# Patient Record
Sex: Male | Born: 1955 | Race: Black or African American | Hispanic: No | Marital: Single | Smoking: Current every day smoker
Health system: Southern US, Community
[De-identification: ages and names within clinical notes are randomized; demographics above are authoritative.]

## PROBLEM LIST (undated history)

## (undated) DIAGNOSIS — E119 Type 2 diabetes mellitus without complications: Secondary | ICD-10-CM

## (undated) DIAGNOSIS — K219 Gastro-esophageal reflux disease without esophagitis: Secondary | ICD-10-CM

## (undated) HISTORY — DX: Gastro-esophageal reflux disease without esophagitis: K21.9

---

## 2000-04-12 ENCOUNTER — Emergency Department (HOSPITAL_COMMUNITY): Admission: EM | Admit: 2000-04-12 | Discharge: 2000-04-12 | Payer: Self-pay | Admitting: *Deleted

## 2000-04-13 ENCOUNTER — Emergency Department (HOSPITAL_COMMUNITY): Admission: EM | Admit: 2000-04-13 | Discharge: 2000-04-13 | Payer: Self-pay | Admitting: Emergency Medicine

## 2002-05-31 ENCOUNTER — Emergency Department (HOSPITAL_COMMUNITY): Admission: EM | Admit: 2002-05-31 | Discharge: 2002-05-31 | Payer: Self-pay | Admitting: Emergency Medicine

## 2004-01-13 ENCOUNTER — Emergency Department (HOSPITAL_COMMUNITY): Admission: EM | Admit: 2004-01-13 | Discharge: 2004-01-13 | Payer: Self-pay | Admitting: Emergency Medicine

## 2014-05-03 ENCOUNTER — Ambulatory Visit (INDEPENDENT_AMBULATORY_CARE_PROVIDER_SITE_OTHER): Payer: Self-pay | Admitting: Family Medicine

## 2014-05-03 VITALS — BP 122/67 | HR 92 | Temp 98.1°F | Resp 18 | Ht 72.0 in | Wt 261.4 lb

## 2014-05-03 DIAGNOSIS — Z0289 Encounter for other administrative examinations: Secondary | ICD-10-CM

## 2014-05-03 DIAGNOSIS — Z008 Encounter for other general examination: Secondary | ICD-10-CM

## 2014-05-03 NOTE — Progress Notes (Signed)
Commercial Driver Medical Examination   Andrew Davenport is a 58 y.o. male who presents today for a commercial driver fitness determination physical exam. The patient reports no problems. The following portions of the patient's history were reviewed and updated as appropriate: allergies, current medications, past family history, past medical history, past social history, past surgical history and problem list. Review of Systems A comprehensive review of systems was negative.   Objective:    Vision:   Visual Acuity Screening   Right eye Left eye Both eyes  Without correction:     With correction: 20/15 20/20 20/20-1  Comments: Peripheral Vision: Right eye 85 degrees. Left eye 85 degrees.  The patient can distinguish the colors red, amber and green.  Applicant can recognize and distinguish among traffic control signals and devices showing standard red, green, and amber colors.  Applicant meets visual acuity requirement only when wearing corrective lenses.  Monocular Vision?: No   Hearing:  Hearing Screening Comments: The patient was able to hear a forced whisper from 10 feet.    BP 122/67 mmHg  Pulse 92  Temp(Src) 98.1 F (36.7 C) (Oral)  Resp 18  Ht 6' (1.829 m)  Wt 261 lb 6.4 oz (118.57 kg)  BMI 35.44 kg/m2  SpO2 98%  General Appearance:    Alert, cooperative, no distress, appears stated age  Head:    Normocephalic, without obvious abnormality, atraumatic  Eyes:    PERRL, conjunctiva/corneas clear, EOM's intact, fundi    benign, both eyes       Ears:    Normal TM's and external ear canals, both ears  Nose:   Nares normal, septum midline, mucosa normal, no drainage    or sinus tenderness  Throat:   Lips, mucosa, and tongue normal; teeth and gums normal  Neck:   Supple, symmetrical, trachea midline, no adenopathy;       thyroid:  No enlargement/tenderness/nodules; no carotid   bruit or JVD  Back:     Symmetric, no curvature, ROM normal, no CVA tenderness  Lungs:      Clear to auscultation bilaterally, respirations unlabored  Chest wall:    No tenderness or deformity  Heart:    Regular rate and rhythm, S1 and S2 normal, no murmur, rub   or gallop  Abdomen:     Soft, non-tender, bowel sounds active all four quadrants,    no masses, no organomegaly  Genitalia:    Normal male without lesion, discharge or tenderness  Rectal:    Normal tone, normal prostate, no masses or tenderness;   guaiac negative stool  Extremities:   Extremities normal, atraumatic, no cyanosis or edema  Pulses:   2+ and symmetric all extremities  Skin:   Skin color, texture, turgor normal, no rashes or lesions  Lymph nodes:   Cervical, supraclavicular, and axillary nodes normal  Neurologic:   CNII-XII intact. Normal strength, sensation and reflexes      throughout    Labs: No results found for: SPECGRAV, PROTEINUR, BILIRUBINUR, GLUCOSEU  Urine SG 1.015, neg gluc, neg blood, neg prot  Assessment:    Healthy male exam.  Meets standards in 30 CFR 391.41;  qualifies for 2 year certificate.    Plan:    Medical examiners certificate completed and printed. Return as needed.

## 2016-04-29 ENCOUNTER — Ambulatory Visit (INDEPENDENT_AMBULATORY_CARE_PROVIDER_SITE_OTHER): Payer: Self-pay | Admitting: Family Medicine

## 2016-04-29 VITALS — BP 128/80 | HR 89 | Temp 98.5°F | Resp 17 | Ht 72.0 in | Wt 277.0 lb

## 2016-04-29 DIAGNOSIS — Z024 Encounter for examination for driving license: Secondary | ICD-10-CM

## 2016-04-29 NOTE — Patient Instructions (Addendum)
  2 year DOT card given. If you experience more snoring, or any daytime sleepiness, discuss those symptoms with your primary care provider as a sleep study may be indicated.     IF you received an x-ray today, you will receive an invoice from [facility]. Please contact [facility] at [phone] with questions or concerns regarding your invoice.   IF you received labwork today, you will receive an invoice from [employer]. Please contact [facility] at [phone] with questions or concerns regarding your invoice.   Our billing staff will not be able to assist you with questions regarding bills from these companies.  You will be contacted with the lab results as soon as they are available. The fastest way to get your results is to activate your My Chart account. Instructions are located on the last page of this paperwork. If you have not heard from [place] regarding the results in 2 weeks, please contact this office.

## 2016-04-29 NOTE — Progress Notes (Signed)
Subjective:  By signing my name below, I, [name], attest that this documentation has been prepared under the direction and in the presence of [name], MD Electronically Signed: Ladene Artist, ED Scribe 04/29/2016 at 11:22 AM.   Patient ID: Andrew Davenport, male    DOB: 1956-04-29, 60 y.o.   MRN: [id number]  Chief Complaint  Patient presents with  . Employment Physical    DOT    HPI HPI Comments: Andrew Davenport is a 60 y.o. male who presents to the [facility] for a DOT physical. No listed medial problems or medications. He does smoke tobacco. Previous DOT physical in Dec 2015, 2 year card. No concerns identified. Pt denies coughing spells while driving, sob, unexplained weight loss, fever, chest pain, weakness in extremities. He reports occasional snoring but denies daytime somnolence or sleep apnea. Pt denies depression or difficulty coping with the loss of his father 2 weeks ago.  There are no active problems to display for this patient.  Past Medical History:  Diagnosis Date  . GERD (gastroesophageal reflux disease)    No past surgical history on file. No Known Allergies Prior to Admission medications   Not on File   Social History   Social History  . Marital status: Single    Spouse name: N/A  . Number of children: N/A  . Years of education: N/A   Occupational History  . Not on file.   Social History Main Topics  . Smoking status: Current Every Day Smoker    Packs/day: 0.25    Years: 43.00  . Smokeless tobacco: Never Used  . Alcohol use No  . Drug use: No  . Sexual activity: No   Other Topics Concern  . Not on file   Social History Narrative  . No narrative on file   Review of Systems  Constitutional: Negative for fatigue, fever and unexpected weight change.  Respiratory: Negative for cough and shortness of breath.   Cardiovascular: Negative for chest pain.  Neurological: Negative for weakness.    Psychiatric/Behavioral: Negative for dysphoric mood and sleep disturbance.      Objective:   Physical Exam  Constitutional: He is oriented to person, place, and time. He appears well-developed and well-nourished.  HENT:  Head: Normocephalic and atraumatic.  Right Ear: External ear normal.  Left Ear: External ear normal.  Mouth/Throat: Oropharynx is clear and moist.  Eyes: Conjunctivae and EOM are normal. Pupils are equal, round, and reactive to light.  Neck: Normal range of motion. Neck supple. No thyromegaly present.  Cardiovascular: Normal rate, regular rhythm, normal heart sounds and intact distal pulses.   Pulmonary/Chest: Effort normal and breath sounds normal. No respiratory distress. He has no wheezes.  Abdominal: Soft. He exhibits no distension. There is no tenderness. Hernia confirmed negative in the right inguinal area and confirmed negative in the left inguinal area.  Musculoskeletal: Normal range of motion. He exhibits no edema or tenderness.  Lymphadenopathy:    He has no cervical adenopathy.  Neurological: He is alert and oriented to person, place, and time. He has normal reflexes.  Skin: Skin is warm and dry.  Psychiatric: He has a normal mood and affect. His behavior is normal.  Vitals reviewed.   Vitals:   04/29/16 1102  BP: 128/80  Pulse: 89  Resp: 17  Temp: 98.5 F (36.9 C)  TempSrc: Oral  SpO2: 96%  Weight: 277 lb (125.6 kg)  Height: 6' (1.829 m)    Visual  Acuity Screening   Right eye Left eye Both eyes  Without correction:     With correction: '20/30 20/30 20/30 '$  Hearing Screening Comments: Peripheral Vision: Right eye 85 degrees. Left eye 85 degrees. The patient can distinguish the colors red, amber and green. The patient was able to hear a forced whisper from L=10 R=10  feet.     Assessment & Plan:  Andrew Davenport is a 60 y.o. male Encounter for commercial driver medical examination (CDME) No concerning findings on exam or history. BMI  greater than 35, but denies persistent daytime somnolence or persistent snoring. If he does have more snoring, or any daytime somnolence, advised to discuss with PCP for possible sleep study. 2 year card given, corrective lenses, see DOT paperwork.  No orders of the defined types were placed in this encounter.  Patient Instructions    2 year DOT card given. If you experience more snoring, or any daytime sleepiness, discuss those symptoms with your primary care provider as a sleep study may be indicated.     IF you received an x-ray today, you will receive an invoice from [facility]. Please contact [facility] at [phone] with questions or concerns regarding your invoice.   IF you received labwork today, you will receive an invoice from [employer]. Please contact [facility] at [phone] with questions or concerns regarding your invoice.   Our billing staff will not be able to assist you with questions regarding bills from these companies.  You will be contacted with the lab results as soon as they are available. The fastest way to get your results is to activate your My Chart account. Instructions are located on the last page of this paperwork. If you have not heard from [place] regarding the results in 2 weeks, please contact this office.         I personally performed the services described in this documentation, which was scribed in my presence. The recorded information has been reviewed and considered, and addended by me as needed.   Signed,   Merri Ray, MD [facility].  04/29/16 11:52 AM

## 2019-01-06 ENCOUNTER — Emergency Department (HOSPITAL_COMMUNITY): Payer: No Typology Code available for payment source

## 2019-01-06 ENCOUNTER — Inpatient Hospital Stay (HOSPITAL_COMMUNITY)
Admission: EM | Admit: 2019-01-06 | Discharge: 2019-01-13 | DRG: 166 | Disposition: A | Discharge status: Home Health | Payer: No Typology Code available for payment source | Attending: Family Medicine | Admitting: Family Medicine

## 2019-01-06 ENCOUNTER — Other Ambulatory Visit: Payer: Self-pay

## 2019-01-06 DIAGNOSIS — Z8701 Personal history of pneumonia (recurrent): Secondary | ICD-10-CM

## 2019-01-06 DIAGNOSIS — Z515 Encounter for palliative care: Secondary | ICD-10-CM | POA: Diagnosis present

## 2019-01-06 DIAGNOSIS — J9621 Acute and chronic respiratory failure with hypoxia: Secondary | ICD-10-CM | POA: Diagnosis present

## 2019-01-06 DIAGNOSIS — G893 Neoplasm related pain (acute) (chronic): Secondary | ICD-10-CM | POA: Diagnosis present

## 2019-01-06 DIAGNOSIS — Z20828 Contact with and (suspected) exposure to other viral communicable diseases: Secondary | ICD-10-CM | POA: Diagnosis present

## 2019-01-06 DIAGNOSIS — E119 Type 2 diabetes mellitus without complications: Secondary | ICD-10-CM | POA: Diagnosis present

## 2019-01-06 DIAGNOSIS — D1809 Hemangioma of other sites: Secondary | ICD-10-CM | POA: Diagnosis present

## 2019-01-06 DIAGNOSIS — J90 Pleural effusion, not elsewhere classified: Secondary | ICD-10-CM | POA: Diagnosis not present

## 2019-01-06 DIAGNOSIS — C3491 Malignant neoplasm of unspecified part of right bronchus or lung: Secondary | ICD-10-CM | POA: Diagnosis present

## 2019-01-06 DIAGNOSIS — F419 Anxiety disorder, unspecified: Secondary | ICD-10-CM | POA: Diagnosis not present

## 2019-01-06 DIAGNOSIS — J910 Malignant pleural effusion: Secondary | ICD-10-CM | POA: Diagnosis present

## 2019-01-06 DIAGNOSIS — E1122 Type 2 diabetes mellitus with diabetic chronic kidney disease: Secondary | ICD-10-CM | POA: Diagnosis not present

## 2019-01-06 DIAGNOSIS — C3490 Malignant neoplasm of unspecified part of unspecified bronchus or lung: Secondary | ICD-10-CM | POA: Diagnosis not present

## 2019-01-06 DIAGNOSIS — Z7984 Long term (current) use of oral hypoglycemic drugs: Secondary | ICD-10-CM | POA: Diagnosis not present

## 2019-01-06 DIAGNOSIS — Z87891 Personal history of nicotine dependence: Secondary | ICD-10-CM | POA: Diagnosis not present

## 2019-01-06 DIAGNOSIS — R0902 Hypoxemia: Secondary | ICD-10-CM

## 2019-01-06 DIAGNOSIS — Z7189 Other specified counseling: Secondary | ICD-10-CM | POA: Diagnosis not present

## 2019-01-06 DIAGNOSIS — Z66 Do not resuscitate: Secondary | ICD-10-CM | POA: Diagnosis not present

## 2019-01-06 DIAGNOSIS — Z833 Family history of diabetes mellitus: Secondary | ICD-10-CM | POA: Diagnosis not present

## 2019-01-06 DIAGNOSIS — D638 Anemia in other chronic diseases classified elsewhere: Secondary | ICD-10-CM | POA: Diagnosis present

## 2019-01-06 DIAGNOSIS — K219 Gastro-esophageal reflux disease without esophagitis: Secondary | ICD-10-CM | POA: Diagnosis present

## 2019-01-06 DIAGNOSIS — E871 Hypo-osmolality and hyponatremia: Secondary | ICD-10-CM

## 2019-01-06 LAB — BASIC METABOLIC PANEL
Anion gap: 15 (ref 5–15)
BUN: 8 mg/dL (ref 6–20)
CO2: 27 mmol/L (ref 22–32)
Calcium: 9.4 mg/dL (ref 8.9–10.3)
Chloride: 89 mmol/L — ABNORMAL LOW (ref 98–111)
Creatinine, Ser: 0.91 mg/dL (ref 0.61–1.24)
GFR calc Af Amer: >60 mL/min (ref >60)
GFR calc non Af Amer: >60 mL/min (ref >60)
Glucose, Bld: 131 mg/dL — ABNORMAL HIGH (ref 70–99)
Potassium: 3.7 mmol/L (ref 3.5–5.1)
Sodium: 131 mmol/L — ABNORMAL LOW (ref 135–145)

## 2019-01-06 LAB — CBC WITH DIFFERENTIAL/PLATELET
Abs Immature Granulocytes: 0.18 10*3/uL — ABNORMAL HIGH (ref 0.00–0.07)
Basophils Absolute: 0 10*3/uL (ref 0.0–0.1)
Basophils Relative: 0 %
Eosinophils Absolute: 0.1 10*3/uL (ref 0.0–0.5)
Eosinophils Relative: 1 %
HCT: 44.3 % (ref 39.0–52.0)
Hemoglobin: 14.4 g/dL (ref 13.0–17.0)
Immature Granulocytes: 2 %
Lymphocytes Relative: 20 %
Lymphs Abs: 2.1 10*3/uL (ref 0.7–4.0)
MCH: 27.3 pg (ref 26.0–34.0)
MCHC: 32.5 g/dL (ref 30.0–36.0)
MCV: 83.9 fL (ref 80.0–100.0)
Monocytes Absolute: 0.9 10*3/uL (ref 0.1–1.0)
Monocytes Relative: 9 %
Neutro Abs: 7 10*3/uL (ref 1.7–7.7)
Neutrophils Relative %: 68 %
Platelets: 392 10*3/uL (ref 150–400)
RBC: 5.28 MIL/uL (ref 4.22–5.81)
RDW: 13.8 % (ref 11.5–15.5)
WBC: 10.3 10*3/uL (ref 4.0–10.5)
nRBC: 0 % (ref 0.0–0.2)

## 2019-01-06 LAB — TROPONIN I (HIGH SENSITIVITY)
Troponin I (High Sensitivity): 4 ng/L (ref <18)
Troponin I (High Sensitivity): 5 ng/L (ref <18)

## 2019-01-06 LAB — GLUCOSE, CAPILLARY: Glucose-Capillary: 125 mg/dL — ABNORMAL HIGH (ref 70–99)

## 2019-01-06 LAB — SARS CORONAVIRUS 2 BY RT PCR (HOSPITAL ORDER, PERFORMED IN ~~LOC~~ HOSPITAL LAB): SARS Coronavirus 2: NEGATIVE

## 2019-01-06 MED ORDER — MORPHINE SULFATE (PF) 4 MG/ML IV SOLN
4.0000 mg | Freq: Once | INTRAVENOUS | Status: AC
  Administered 2019-01-06: 4 mg via INTRAVENOUS
  Filled 2019-01-06: qty 1

## 2019-01-06 MED ORDER — ACETAMINOPHEN 325 MG PO TABS: 650.0000 mg | ORAL_TABLET | Freq: Four times a day (QID) | ORAL | Status: DC | PRN

## 2019-01-06 MED ORDER — ACETAMINOPHEN 650 MG RE SUPP: 650.0000 mg | Freq: Four times a day (QID) | RECTAL | Status: DC | PRN

## 2019-01-06 MED ORDER — IOHEXOL 300 MG/ML  SOLN
75.0000 mL | Freq: Once | INTRAMUSCULAR | Status: AC | PRN
  Administered 2019-01-06: 75 mL via INTRAVENOUS

## 2019-01-06 MED ORDER — IBUPROFEN 600 MG PO TABS
600.0000 mg | ORAL_TABLET | Freq: Four times a day (QID) | ORAL | Status: DC | PRN
  Administered 2019-01-06 – 2019-01-07 (×2): 600 mg via ORAL
  Filled 2019-01-06 (×2): qty 1

## 2019-01-06 MED ORDER — LIDOCAINE 5 % EX PTCH
1.0000 | MEDICATED_PATCH | CUTANEOUS | Status: AC
  Administered 2019-01-06: 1 via TRANSDERMAL
  Filled 2019-01-06: qty 1

## 2019-01-06 MED ORDER — ENSURE ENLIVE PO LIQD
237.0000 mL | Freq: Two times a day (BID) | ORAL | Status: DC
  Administered 2019-01-07 – 2019-01-12 (×6): 237 mL via ORAL

## 2019-01-06 MED ORDER — ENOXAPARIN SODIUM 40 MG/0.4ML ~~LOC~~ SOLN
40.0000 mg | SUBCUTANEOUS | Status: DC
  Administered 2019-01-06: 40 mg via SUBCUTANEOUS
  Filled 2019-01-06: qty 0.4

## 2019-01-06 MED ORDER — ONDANSETRON HCL 4 MG PO TABS: 4.0000 mg | ORAL_TABLET | Freq: Four times a day (QID) | ORAL | Status: DC | PRN

## 2019-01-06 MED ORDER — HYDROCODONE-ACETAMINOPHEN 5-325 MG PO TABS
1.0000 | ORAL_TABLET | Freq: Four times a day (QID) | ORAL | Status: DC | PRN
  Administered 2019-01-06 – 2019-01-07 (×3): 1 via ORAL
  Filled 2019-01-06 (×4): qty 1

## 2019-01-06 MED ORDER — PANTOPRAZOLE SODIUM 40 MG PO TBEC
40.0000 mg | DELAYED_RELEASE_TABLET | Freq: Every day | ORAL | Status: DC
  Administered 2019-01-07 – 2019-01-13 (×5): 40 mg via ORAL
  Filled 2019-01-06 (×6): qty 1

## 2019-01-06 MED ORDER — INSULIN ASPART 100 UNIT/ML ~~LOC~~ SOLN
0.0000 [IU] | Freq: Three times a day (TID) | SUBCUTANEOUS | Status: DC
  Administered 2019-01-07 – 2019-01-09 (×4): 2 [IU] via SUBCUTANEOUS
  Administered 2019-01-09: 1 [IU] via SUBCUTANEOUS
  Administered 2019-01-09: 2 [IU] via SUBCUTANEOUS
  Administered 2019-01-10: 1 [IU] via SUBCUTANEOUS
  Administered 2019-01-10: 2 [IU] via SUBCUTANEOUS
  Administered 2019-01-11 (×2): 1 [IU] via SUBCUTANEOUS

## 2019-01-06 MED ORDER — ONDANSETRON HCL 4 MG/2ML IJ SOLN: 4.0000 mg | Freq: Four times a day (QID) | INTRAMUSCULAR | Status: DC | PRN

## 2019-01-06 NOTE — ED Notes (Signed)
PT stood beside bed to use urinal - he became increasingly sob and sats dropped to 90% on RA.

## 2019-01-06 NOTE — ED Notes (Signed)
Pt reports the last time he drained fluid from his chest tube was at 0200 and he pulled of about 351mL.

## 2019-01-06 NOTE — H&P (Addendum)
[facility] Admission History and Physical Service Pager: [phone]  Patient name: Andrew Davenport Medical record number: [id number] Date of birth: [birth year] Age: 63 y.o. Gender: male  Primary Care Provider: Patient, No Pcp Per Consultants: IR Code Status: Full Preferred Emergency Contact: [place]: [phone]  Chief Complaint: Dyspnea  Assessment and Plan: Andrew Davenport is a 63 y.o. male presenting with progressive SOB and right-sided chest pain. PMH is significant for small cell carcinoma, recurrent pleural effusions requiring thoracocentesis.  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 Differential of hypoxemic respiratory failure include pleural effusions, malignancy, pneumothorax, pulmonary embolism, COPD and pna. Patient with dyspnea secondary to malignant pleural effusions and primary lung cancer. Pleural tap results from previous hospital show non-small cell carcinoma cells in this fluid showing involvement of the pleural space. Patient has already required thoracocentesis X 3 in the last hospital admission. Pleurx has been draining over 300 mL a day of pleural fluid which has recently changed in color from dark red to light yellow color.A malignant, pyogenic pleural effusion should also be considered however patient apyrexial.  Pleural fusions have been confirmed on CT chest scan. Patient recently treated with Doxycycline for CAP so pt may have a worsening CAP. However patient apyrexial, denies fevers and is having productive cough with white phlegm so unlikely to be infective cause of dyspnea. CXR shows basal atelectasis/right sided pleural effusion and wbc wnl. PE also considered given patient is hypoxic with dyspnea, well score 2, (moderate risk-malignancy and hemoptysis). CTA should be considered if patient does not improve  following thoracentesis of pleural effusions. Patient had had ongoing right-sided chest pain likely related to pleural effusions or musculoskeletal from coughing.  ACS considered however patient has minimal cardiac risk factors, high sensitivity troponin 1 and 5. EKG showed sinus rhythm and no ischemic changes.  -Admit to tele, attending Dr. Andria Frames -Consult CVTS for pleurX management and possible biopsies -Consult oncology in the am -Large-bore venous access for IR -Continue to monitor and document Pleurx fluid amount and color -Continue oxygen by nasal cannula and wean off when possible -Continue to monitor saturations -Normal diet -Tylenol, ibuprofen, and hydrocodone for pain management -Lovenox for DVT prophylaxis-adequate renal function  Well-controlled Type 2 diabetes mellitus Recent diagnosis. HbA1c 7.4 12/21/2018 Home meds: metformin 750mg  daily. CBG today 131 -Continue to monitor CBGs -start sensitive sliding scale  GERD Home meds 40 mg pantoprazole -Continue pantoprazole  FEN/GI: Normal diet, Protonix Prophylaxis: Lovenox  Disposition: Admit to tele for management of pleural effusions  History of Present Illness:  Andrew Davenport is a 63 y.o. male presenting with progressive shortness of breath and right-sided chest pain. Reports 1 month ago he woke up and felt like he had "golf balls" in his chest. He went to the [place] and t scribed doxycycline for CAP which he completed and started to feel better. He reports he had soreness in his anterior right chest. He was asymptomatic until about 3-4 days after seeing the doctor and he became more SOB and he went to [place]. They diagnosed pleural effusions in his right lung. They drained his fluid and drained "grape juice colored fluid" X 3 times prior to placing a pleural drain. They told him he had cancer cells in the fluid but it was not enough to tell for sure. His drain has been draining 373mL every 12-18 hours with grape juice colored  fluid. Today he had a  change in the color of fluid that was much lighter yellow color. His SOB worsened today while doing his regular hygiene routine and he was unable to catch his breath. He reports smoking for years and has a chronic cough, that is slightly worse recently with productive cough. He reports he is having thick white phlegm which has hard dark specks in it. No hemotpysis. He has noticed a significant weight loss of  26lbs in about a month. He reports being nauseous all the time and hungry. He reports chest pain improved after drain placement for which he has been taking hydrocodone. Denies dizziness, fevers, palpitations, abdominal pain, no urinary symptoms ports good bowel movements denies rectal bleeding or melena.  Results from pleural fluid tap from [facility] show small cell carcinoma cells. Patient had been given this unfortunate news in the ED by the ED doctor today.  In ED he was put on 1.5 L of oxygen via nasal cannula.  He was also given 4 mg of IV morphine for the pain in his chest.  Pertinent labs: Na 131, K 3.7, CR 0.91, BUN 8, glucose 131, WBC 10.3, high sensitive troponins 4 and 5.  X-ray showed right base atelectasis/infiltrate of right-sided pleural effusion.  Right-sided chest tube noted.  No pneumothorax.  CT chest with contrast: Mildly dilated ascending thoracic aorta 4 cm. Mildly enlarged right hilar lymph node measures 1.5 cm. Small amount of loculated right pleural fluid with pleural thickening and restricted expansion of the right lung. Multiple ill-defined bilateral small pulmonary nodules all < 1 cm. Maybe inflammatory in nature based on appearance. Metastatic disease is not excluded and correlation suggested with any known diagnosis of malignancy.  Review Of Systems: Per HPI with the following additions:  Review of Systems  Constitutional: Positive for malaise/fatigue and weight loss. Negative for chills and fever.  Eyes: Negative for blurred vision and double vision.   Respiratory: Positive for cough, sputum production and shortness of breath.   Cardiovascular: Positive for chest pain and leg swelling. Negative for palpitations.  Gastrointestinal: Positive for nausea and vomiting. Negative for blood in stool, constipation and melena.  Genitourinary: Negative for dysuria and hematuria.  Musculoskeletal: Positive for back pain.  Neurological: Positive for dizziness. Negative for headaches.       Dizzy with anxiety and pain    Patient Active Problem List   Diagnosis Date Noted  . Pleural effusion 01/06/2019    Past Medical History: Past Medical History:  Diagnosis Date  . GERD (gastroesophageal reflux disease)     Past Surgical History: No past surgical history on file.  Social History: Social History   Tobacco Use  . Smoking status: Current Every Day Smoker    Packs/day: 0.25    Years: 43.00    Pack years: 10.75  . Smokeless tobacco: Never Used  Substance Use Topics  . Alcohol use: No    Alcohol/week: 0.0 standard drinks  . 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  Please also refer to relevant sections of EMR.  Family History: No family history on file.  Sisters and mom, dad all with htn and t2dm.   Allergies and Medications: No Known Allergies No current facility-administered medications on file prior to encounter.    Current Outpatient Medications on File Prior to Encounter  Medication Sig Dispense Refill  . metFORMIN (GLUCOPHAGE-XR) 750 MG 24 hr tablet Take 750 mg by mouth daily with breakfast.    . pantoprazole (PROTONIX) 40 MG tablet Take 40  mg by mouth daily.      Objective: BP 119/87   Pulse 94   Temp 97.9 F (36.6 C) (Oral)   Resp 18   Ht 6\' 1"  (1.854 m)   Wt 110.7 kg   SpO2 96%   BMI 32.19 kg/m  Exam: General: Normal body habitus, alert, cooperative and appears to be in no acute distress, 1.5 L oxygen nasal cannula Eyes: Normal extraocular eye movements, no icterus, normal  conjunctive and sclera ENTM: Erythema or exudates in pharynx, unable to visualize tonsils, poor dentition, mucous membranes Neck:  Neck supple, no thyromegaly, thyroid cyst, evidence of lymphadenopathy Cardiovascular: S1 and S2 present, no S3 and S4, no rubs or gallops, regular rhythm Respiratory: Reduced air entry from mid to base of right lung, good air entry in left lung no crackles or rhonchi.  No acute respiratory distress Gastrointestinal: Abdomen soft nontender, no organomegaly, bowel sounds present MSK: Moving all 4 limbs Derm: No evidence of rashes or ecchymosis Neuro: Cranial nerves II to XII grossly intact Psych: Normal mood, normal affect  Labs and Imaging: CBC BMET  Recent Labs  Lab 01/06/19 0857  WBC 10.3  HGB 14.4  HCT 44.3  PLT 392   Recent Labs  Lab 01/06/19 0857  NA 131*  K 3.7  CL 89*  CO2 27  BUN 8  CREATININE 0.91  GLUCOSE 131*  CALCIUM 9.4     EKG: Sinus rhythm, no ischemic changes  Lattie Haw, MD 01/06/2019, 4:12 PM PGY-1, [place] Intern pager: [phone], text pages welcome  FPTS Upper-Level Resident Addendum   I have independently interviewed and examined the patient. I have discussed the above with the original author and agree with their documentation. My edits for correction/addition/clarification are in purple. Please see also any attending notes.    [place] Zailee Vallely, DO PGY-3, [place] 01/06/2019 5:58 PM  [place] Service pager: [phone] (text pages welcome through [facility])

## 2019-01-06 NOTE — Progress Notes (Signed)
I saw and examined this patient.  I discussed with the full resident team and we have agreed on a treatment plan.  I will co-sign the H&PE when available. Briefly 63 yo male with ~6 week hx of progressive right sided chest pain and worsening SOB.  Known smoker - quit ~1 month ago.  No known toxic exposure.  May have had some asbestos exposure.Has been receiving care at [facility] - some reports available through care everywhere: Issue 1. 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  He was just notified today that the cell block path from pleural effusion tab showed malignant cells, favor non small cell ca.  He has not had the primary definitively identified.  CT shows multiple nodules.   He has pleurex catheter in place which seems to work. Clearly we will need subspecialty help.  I favor first involving CVTS for definitive biopsies (via VATS?).  Almost certainly he will need an oncology referral.  He desires to have his care at [facility].    Other medical problems are less pressing.  He is already asking pertinent questions:  "Has the cancer spread?"  And making statements that suggest he will want to know all his options, including palliative care.  First things first: We need to better define the type and extent of his malignancy before we can prognose and offer treatment options.

## 2019-01-06 NOTE — ED Triage Notes (Signed)
Pt recently discharged from [facility] in [place]. Pt here with chest pain, shortness of breath, back pain. Pt reports they had to place a tube in his chest to drain fluid, with which he was discharged from [place] with. Pt reports that his symptoms have gotten worse since he was discharged on the 30th of July. Pt is alert and oriented x4 at present

## 2019-01-06 NOTE — ED Notes (Signed)
Report given to [place], RN on 2w

## 2019-01-06 NOTE — Progress Notes (Signed)
Provider notified that patient does not want to have IR procedure tonight and is hungry. Diet was placed, and patient does not need any further procedures this evening if not absolutely necessary.   Wilber Oliphant, M.D.  PGY-2  Family Medicine  [phone] 01/06/2019 6:32 PM

## 2019-01-06 NOTE — ED Notes (Signed)
D

## 2019-01-06 NOTE — ED Notes (Signed)
Dinner tray ordered (cheeseburger requested)

## 2019-01-06 NOTE — ED Notes (Signed)
Pt mom Luellen Pucker [phone]

## 2019-01-06 NOTE — ED Notes (Signed)
ED TO INPATIENT HANDOFF REPORT  ED Nurse Name and Phone #: Lorrin Goodell [phone]  S Name/Age/Gender Andrew Davenport 63 y.o. male Room/Bed: 024C/024C  Code Status   Code Status: Full Code  Home/SNF/Other Home Patient oriented to: self, place, time and situation Is this baseline? Yes   Triage Complete: Triage complete  Chief Complaint SOB/has chest catheter  Triage Note Pt recently discharged from [facility] in [place]. Pt here with chest pain, shortness of breath, back pain. Pt reports they had to place a tube in his chest to drain fluid, with which he was discharged from [facility] with. Pt reports that his symptoms have gotten worse since he was discharged on the 30th of July. Pt is alert and oriented x4 at present    Allergies No Known Allergies  Level of Care/Admitting Diagnosis ED Disposition    ED Disposition Condition [place]: [place] [[id number]]  Level of Care: Telemetry Medical [104]  Covid Evaluation: Confirmed COVID Negative  Diagnosis: Pleural effusion [[id number]]  Admitting Physician: SHIRLEY, [place] [[id number]]  Attending Physician: Madison Hickman A [[id number]]  Estimated length of stay: past midnight tomorrow  Certification:: I certify this patient will need inpatient services for at least 2 midnights  PT Class (Do Not Modify): Inpatient [101]  PT Acc Code (Do Not Modify): Private [1]       B Medical/Surgery History Past Medical History:  Diagnosis Date  . GERD (gastroesophageal reflux disease)    No past surgical history on file.   A IV Location/Drains/Wounds Patient Lines/Drains/Airways Status   Active Line/Drains/Airways    Name:   Placement date:   Placement time:   Site:   Days:   Peripheral IV 01/06/19 Left Wrist   01/06/19    1001    Wrist   less than 1          Intake/Output Last 24 hours No intake or output data in the 24 hours ending 01/06/19 1928  Labs/Imaging Results for orders placed or  performed during the hospital encounter of 01/06/19 (from the past 48 hour(s))  Basic metabolic panel     Status: Abnormal   Collection Time: 01/06/19  8:57 AM  Result Value Ref Range   Sodium 131 (L) 135 - 145 mmol/L   Potassium 3.7 3.5 - 5.1 mmol/L   Chloride 89 (L) 98 - 111 mmol/L   CO2 27 22 - 32 mmol/L   Glucose, Bld 131 (H) 70 - 99 mg/dL   BUN 8 6 - 20 mg/dL   Creatinine, Ser 0.91 0.61 - 1.24 mg/dL   Calcium 9.4 8.9 - 10.3 mg/dL   GFR calc non Af Amer >60 >60 mL/min   GFR calc Af Amer >60 >60 mL/min   Anion gap 15 5 - 15    Comment: Performed at [facility], [place] [address], [place], [place] [address]  CBC with Differential     Status: Abnormal   Collection Time: 01/06/19  8:57 AM  Result Value Ref Range   WBC 10.3 4.0 - 10.5 K/uL   RBC 5.28 4.22 - 5.81 MIL/uL   Hemoglobin 14.4 13.0 - 17.0 g/dL   HCT 44.3 39.0 - 52.0 %   MCV 83.9 80.0 - 100.0 fL   MCH 27.3 26.0 - 34.0 pg   MCHC 32.5 30.0 - 36.0 g/dL   RDW 13.8 11.5 - 15.5 %   Platelets 392 150 - 400 K/uL   nRBC 0.0 0.0 - 0.2 %  Neutrophils Relative % 68 %   Neutro Abs 7.0 1.7 - 7.7 K/uL   Lymphocytes Relative 20 %   Lymphs Abs 2.1 0.7 - 4.0 K/uL   Monocytes Relative 9 %   Monocytes Absolute 0.9 0.1 - 1.0 K/uL   Eosinophils Relative 1 %   Eosinophils Absolute 0.1 0.0 - 0.5 K/uL   Basophils Relative 0 %   Basophils Absolute 0.0 0.0 - 0.1 K/uL   Immature Granulocytes 2 %   Abs Immature Granulocytes 0.18 (H) 0.00 - 0.07 K/uL    Comment: Performed at [facility] [address], [place], [place] [address]  Troponin I (High Sensitivity)     Status: None   Collection Time: 01/06/19  8:57 AM  Result Value Ref Range   Troponin I (High Sensitivity) 4 <18 ng/L    Comment: (NOTE) Elevated high sensitivity troponin I (hsTnI) values and significant  changes across serial measurements may suggest ACS but many other  chronic and acute conditions are known to elevate hsTnI results.  Refer to the "Links" section  for chest pain algorithms and additional  guidance. Performed at [facility], [place] [address], [place], [place] [address]   SARS Coronavirus 2 [facility] order, Performed in [facility] hospital lab) Nasopharyngeal Nasopharyngeal Swab     Status: None   Collection Time: 01/06/19 10:02 AM   Specimen: Nasopharyngeal Swab  Result Value Ref Range   SARS Coronavirus 2 NEGATIVE NEGATIVE    Comment: (NOTE) If result is NEGATIVE SARS-CoV-2 target nucleic acids are NOT DETECTED. The SARS-CoV-2 RNA is generally detectable in upper and lower  respiratory specimens during the acute phase of infection. The lowest  concentration of SARS-CoV-2 viral copies this assay can detect is 250  copies / mL. A negative result does not preclude SARS-CoV-2 infection  and should not be used as the sole basis for treatment or other  patient management decisions.  A negative result may occur with  improper specimen collection / handling, submission of specimen other  than nasopharyngeal swab, presence of viral mutation(s) within the  areas targeted by this assay, and inadequate number of viral copies  (<250 copies / mL). A negative result must be combined with clinical  observations, patient history, and epidemiological information. If result is POSITIVE SARS-CoV-2 target nucleic acids are DETECTED. The SARS-CoV-2 RNA is generally detectable in upper and lower  respiratory specimens dur ing the acute phase of infection.  Positive  results are indicative of active infection with SARS-CoV-2.  Clinical  correlation with patient history and other diagnostic information is  necessary to determine patient infection status.  Positive results do  not rule out bacterial infection or co-infection with other viruses. If result is PRESUMPTIVE POSTIVE SARS-CoV-2 nucleic acids MAY BE PRESENT.   A presumptive positive result was obtained on the submitted specimen  and confirmed on repeat testing.  While 2019 novel  coronavirus  (SARS-CoV-2) nucleic acids may be present in the submitted sample  additional confirmatory testing may be necessary for epidemiological  and / or clinical management purposes  to differentiate between  SARS-CoV-2 and other Sarbecovirus currently known to infect humans.  If clinically indicated additional testing with an alternate test  methodology [phone]) is advised. The SARS-CoV-2 RNA is generally  detectable in upper and lower respiratory sp ecimens during the acute  phase of infection. The expected result is Negative. Fact Sheet for Patients:  StrictlyIdeas.no Fact Sheet for Healthcare Providers: BankingDealers.co.za This test is not yet approved or  cleared by the [place] and has been authorized for detection and/or diagnosis of SARS-CoV-2 by FDA under an Emergency Use Authorization (EUA).  This EUA will remain in effect (meaning this test can be used) for the duration of the COVID-19 declaration under Section 564(b)(1) of the Act, 21 U.S.C. section 360bbb-3(b)(1), unless the authorization is terminated or revoked sooner. Performed at [facility], [place] [address], [place], [place] [address]   Troponin I (High Sensitivity)     Status: None   Collection Time: 01/06/19 10:57 AM  Result Value Ref Range   Troponin I (High Sensitivity) 5 <18 ng/L    Comment: (NOTE) Elevated high sensitivity troponin I (hsTnI) values and significant  changes across serial measurements may suggest ACS but many other  chronic and acute conditions are known to elevate hsTnI results.  Refer to the "Links" section for chest pain algorithms and additional  guidance. Performed at [facility], [place] [address] [place], [place] [address]    Dg Chest 2 View  Result Date: 01/06/2019 CLINICAL DATA:  Shortness of breath. Chest pain. Pleural effusion. Chest tube. EXAM: CHEST - 2 VIEW COMPARISON:  No recent prior. FINDINGS: Heart  size normal. Right base atelectasis/infiltrate and right-sided pleural effusion. Right chest tube noted with tip over the right lower chest. No pneumothorax. Mild patchy infiltrates in the left lung cannot be excluded. Degenerative change thoracic spine. IMPRESSION: 1. Right base atelectasis/infiltrate right-sided pleural effusion. Right chest tube noted with tip over the right lower chest. No pneumothorax. 2. Mild patchy infiltrates in left lung cannot be excluded. Electronically Signed   By: Marcello Moores  Register   On: 01/06/2019 09:27   Ct Chest W Contrast  Result Date: 01/06/2019 CLINICAL DATA:  Chest pain, shortness of breath, back pain and recent history of right pleural effusion with tunneled PleurX catheter placement at outside institution. Chest x-ray demonstrates component of right-sided pleural fluid and pulmonary airspace opacity. EXAM: CT CHEST WITH CONTRAST TECHNIQUE: Multidetector CT imaging of the chest was performed during intravenous contrast administration. CONTRAST:  76mL OMNIPAQUE IOHEXOL 300 MG/ML  SOLN COMPARISON:  Chest x-ray earlier today. FINDINGS: Cardiovascular: The ascending thoracic aorta is mildly dilated and measures 4 cm in maximal measured diameter. The aortic root is normal in caliber and measures 3.4-3.6 cm. The proximal arch measures 3.8 cm and the distal arch 2.8 cm. The descending thoracic aorta measures 2.6 cm. No evidence of aortic dissection. Proximal great vessels are normally patent and demonstrate bovine branching anatomy. The pulmonary arteries are also well opacified and demonstrates normal caliber without visible pulmonary embolism. Mediastinum/Nodes: No evidence of mediastinal lymphadenopathy or mass. Mildly prominent right hilar lymph node measures approximately 1.5 cm. Unremarkable thyroid gland, of CT appearance of the esophagus and trachea. Lungs/Pleura: Tunneled pleural drainage catheter enters the lateral pleural space and ascends posteriorly. There is a small  amount of loculated pleural fluid present in the pleural space with associated pleural thickening. Parenchymal atelectasis and scarring present with lack of complete expansion of the right lung. There are multiple bilateral ill-defined pulmonary nodules in both upper and lower lobes. These are scattered predominantly in a peripheral distribution and are all approximately 7 mm or less in size. These are not smoothly demarcated nodules and may be inflammatory in nature. However, metastatic disease cannot be excluded and correlation suggested with any known diagnosis of malignancy. No pneumothorax, pulmonary edema or significant focal airspace consolidation is identified. Upper Abdomen: Focal hypervascular focus in a relatively arterial phase of  imaging in the liver in segment III measures approximately 1.7 cm in greatest diameter. Differential considerations include hemangioma, perfusion anomaly or hypervascular mass. No other focal abnormalities are identified in the visualized upper to mid liver. Musculoskeletal: No fractures or focal bony lesions identified. IMPRESSION: 1. Mildly dilated ascending thoracic aorta measures 4 cm in greatest diameter. Recommend annual imaging followup by CTA or MRA. This recommendation follows 2010 ACCF/AHA/AATS/ACR/ASA/SCA/SCAI/SIR/STS/SVM Guidelines for the Diagnosis and Management of Patients with Thoracic Aortic Disease. Circulation. 2010; 121: F818-E993. Aortic aneurysm NOS (ICD10-I71.9) 2. Mildly enlarged right hilar lymph node measures 1.5 cm in short axis. 3. Small amount of loculated right pleural fluid with associated pleural thickening and restricted expansion of the right lung. Indwelling PleurX catheter present. 4. Multiple ill-defined bilateral small pulmonary nodules all measuring under 1 cm. These may be inflammatory in nature based on appearance. However, metastatic disease is not excluded and correlation suggested with any known diagnosis of malignancy. 5. 1.7 cm  hypervascular lesion in segment III of the liver with differential including hemangioma, perfusion anomaly or hypervascular mass. Correlation suggested with any prior abdominal CT or MRI imaging. Aortic aneurysm NOS (ICD10-I71.9). Electronically Signed   By: Aletta Edouard M.D.   On: 01/06/2019 14:19    Pending Labs Unresulted Labs (From admission, onward)    Start     Ordered   01/07/19 0500  Comprehensive metabolic panel  Tomorrow morning,   R     01/06/19 1711   01/07/19 0500  CBC  Tomorrow morning,   R     01/06/19 1711   01/06/19 1712  HIV antibody (Routine Testing)  Once,   STAT     01/06/19 1711          Vitals/Pain Today's Vitals   01/06/19 1415 01/06/19 1430 01/06/19 1545 01/06/19 1630  BP: (!) 150/89 112/70    Pulse: 93 95 96 (!) 106  Resp: (!) 25 17 (!) 22 (!) 26  Temp:      TempSrc:      SpO2: 96% 98% 98% 99%  Weight:      Height:        Isolation Precautions No active isolations  Medications Medications  enoxaparin (LOVENOX) injection 40 mg (has no administration in time range)  acetaminophen (TYLENOL) tablet 650 mg (has no administration in time range)    Or  acetaminophen (TYLENOL) suppository 650 mg (has no administration in time range)  ibuprofen (ADVIL) tablet 600 mg (has no administration in time range)  ondansetron (ZOFRAN) tablet 4 mg (has no administration in time range)    Or  ondansetron (ZOFRAN) injection 4 mg (has no administration in time range)  HYDROcodone-acetaminophen (NORCO/VICODIN) 5-325 MG per tablet 1 tablet (1 tablet Oral Given 01/06/19 1745)  insulin aspart (novoLOG) injection 0-9 Units (has no administration in time range)  pantoprazole (PROTONIX) EC tablet 40 mg (has no administration in time range)  morphine 4 MG/ML injection 4 mg (4 mg Intravenous Given 01/06/19 1146)  iohexol (OMNIPAQUE) 300 MG/ML solution 75 mL (75 mLs Intravenous Contrast Given 01/06/19 1400)    Mobility walks Low fall risk   Focused Assessments Pulmonary  Assessment Handoff:  Lung sounds: L Breath Sounds: Diminished R Breath Sounds: Clear O2 Device: Room Air        R Recommendations: See Admitting Provider Note  Report given to:   Additional Notes:

## 2019-01-06 NOTE — ED Notes (Signed)
Patient sister Marbin Olshefski calling needing to know if patient is going to be staying the night and or being admitted  She needs to know for she needs to call [place] if he will be.  Please call  [phone]

## 2019-01-06 NOTE — ED Notes (Signed)
Attempted report 

## 2019-01-06 NOTE — ED Provider Notes (Signed)
[facility] EMERGENCY DEPARTMENT Provider Note   CSN: [id number] Arrival date & time: 01/06/19  0240    History   Chief Complaint Chief Complaint  Patient presents with  . Shortness of Breath  . Chest Pain    HPI NYKO GELL is a 63 y.o. male with history of tobacco use, diabetes, recent hospital admission at [facility] (7/19-7/30) for right pleural effusion/hemithorax, pneumonia s/p several thoracentesis, pleural drainage tube presents to the ER for evaluation of increased shortness of breath onset this morning at around 2 AM.  Associated with persistent right-sided sharp stab-like pain on the right side of his chest that radiates to his right thoracic back, persistent cough with white foamy sputum.  Patient has been managing his Pleurx drain at home.  He drains it every 18 hours and has been consistently getting at least 300 cc of grape juice type thick fluid.  States this morning he was doing his routine drainage which typically causes him some chest pain, shortness of breath and cough but usually this subsides however at this morning this was persistent and he continued to have shortness of breath.  He is dyspneic on exertion.  He has not been able to lay flat to sleep since hospital discharge and he is having to sleep sitting up on his chair.  Has been checking his weight and temperature every morning and he has not had any fever or increased weight gain.  Actually states that in the last few weeks he has dropped at least 30 to 40 pounds, unintentionally.  Other associate symptoms include nausea, decreased appetite, fatigue.  He denies any fever, vomiting, abdominal pain, diarrhea.  No sick contacts. States at discharge he was told to stop taking furosemide but then started noticing bilateral foot swelling so he started taking 40 mg daily and feet swelling has resolved. No calf pain.      HPI  Past Medical History:  Diagnosis Date  . GERD (gastroesophageal reflux  disease)     Patient Active Problem List   Diagnosis Date Noted  . Pleural effusion 01/06/2019    No past surgical history on file.      Home Medications    Prior to Admission medications   Medication Sig Start Date End Date Taking? Authorizing Provider  metFORMIN (GLUCOPHAGE-XR) 750 MG 24 hr tablet Take 750 mg by mouth daily with breakfast.   Yes [provider]  pantoprazole (PROTONIX) 40 MG tablet Take 40 mg by mouth daily.   Yes [provider]    Family History No family history on file.  Social History Social History   Tobacco Use  . Smoking status: Current Every Day Smoker    Packs/day: 0.25    Years: 43.00    Pack years: 10.75  . Smokeless tobacco: Never Used  Substance Use Topics  . Alcohol use: No    Alcohol/week: 0.0 standard drinks  . Drug use: No     Allergies   Patient has no known allergies.   Review of Systems Review of Systems  Constitutional: Positive for fatigue.  Respiratory: Positive for cough and shortness of breath.   Cardiovascular: Positive for chest pain.  Gastrointestinal: Positive for nausea.  All other systems reviewed and are negative.    Physical Exam Updated Vital Signs BP 119/87   Pulse 94   Temp 97.9 F (36.6 C) (Oral)   Resp 18   Ht 6\' 1"  (1.854 m)   Wt 110.7 kg   SpO2 96%  BMI 32.19 kg/m   Physical Exam Vitals signs and nursing note reviewed.  Constitutional:      General: He is not in acute distress.    Appearance: He is well-developed.     Comments: NAD.  HENT:     Head: Normocephalic and atraumatic.     Right Ear: External ear normal.     Left Ear: External ear normal.     Nose: Nose normal.  Eyes:     General: No scleral icterus.    Conjunctiva/sclera: Conjunctivae normal.  Neck:     Musculoskeletal: Normal range of motion and neck supple.  Cardiovascular:     Rate and Rhythm: Normal rate and regular rhythm.     Heart sounds: Normal heart sounds. No murmur.     Comments: 1+  radial and DP pulses bilaterally.  No lower extremity edema.  No calf tenderness. Pulmonary:     Effort: Pulmonary effort is normal. Tachypnea present.     Breath sounds: Examination of the right-middle field reveals decreased breath sounds. Examination of the right-lower field reveals decreased breath sounds. Examination of the left-lower field reveals decreased breath sounds. Decreased breath sounds present.     Comments: Tachypnea RR in the low 20s.  Patient appears slightly winded during conversation but is maintaining SPO2 greater than 94 on room air during exam.  Diminished lung sounds to right middle/lower lobes.  Diminished lung sounds to left lower lobe. Chest:     Comments: No reproducible chest wall tenderness.  Pain noted in the chest with coughing and taking deep breaths. Abdominal:     Palpations: Abdomen is soft.     Tenderness: There is no abdominal tenderness.  Musculoskeletal: Normal range of motion.        General: No deformity.  Skin:    General: Skin is warm and dry.     Capillary Refill: Capillary refill takes less than 2 seconds.  Neurological:     Mental Status: He is alert and oriented to person, place, and time.  Psychiatric:        Behavior: Behavior normal.        Thought Content: Thought content normal.        Judgment: Judgment normal.      ED Treatments / Results  Labs (all labs ordered are listed, but only abnormal results are displayed) Labs Reviewed  BASIC METABOLIC PANEL - Abnormal; Notable for the following components:      Result Value   Sodium 131 (*)    Chloride 89 (*)    Glucose, Bld 131 (*)    All other components within normal limits  CBC WITH DIFFERENTIAL/PLATELET - Abnormal; Notable for the following components:   Abs Immature Granulocytes 0.18 (*)    All other components within normal limits  SARS CORONAVIRUS 2 (HOSPITAL ORDER, [facility])  TROPONIN I (HIGH SENSITIVITY)  TROPONIN I (HIGH SENSITIVITY)     EKG EKG Interpretation  Date/Time:  Wednesday January 06 2019 09:28:06 EDT Ventricular Rate:  94 PR Interval:    QRS Duration: 82 QT Interval:  360 QTC Calculation: 451 R Axis:   65 Text Interpretation:  Sinus rhythm Probable left atrial enlargement no prior ECG for comparuson.  No STEMI Confirmed by Antony Blackbird [phone]) on 01/06/2019 10:04:27 AM   Radiology Dg Chest 2 View  Result Date: 01/06/2019 CLINICAL DATA:  Shortness of breath. Chest pain. Pleural effusion. Chest tube. EXAM: CHEST - 2 VIEW COMPARISON:  No recent prior. FINDINGS: Heart size  normal. Right base atelectasis/infiltrate and right-sided pleural effusion. Right chest tube noted with tip over the right lower chest. No pneumothorax. Mild patchy infiltrates in the left lung cannot be excluded. Degenerative change thoracic spine. IMPRESSION: 1. Right base atelectasis/infiltrate right-sided pleural effusion. Right chest tube noted with tip over the right lower chest. No pneumothorax. 2. Mild patchy infiltrates in left lung cannot be excluded. Electronically Signed   By: Marcello Moores  Register   On: 01/06/2019 09:27   Ct Chest W Contrast  Result Date: 01/06/2019 CLINICAL DATA:  Chest pain, shortness of breath, back pain and recent history of right pleural effusion with tunneled PleurX catheter placement at outside institution. Chest x-ray demonstrates component of right-sided pleural fluid and pulmonary airspace opacity. EXAM: CT CHEST WITH CONTRAST TECHNIQUE: Multidetector CT imaging of the chest was performed during intravenous contrast administration. CONTRAST:  78mL OMNIPAQUE IOHEXOL 300 MG/ML  SOLN COMPARISON:  Chest x-ray earlier today. FINDINGS: Cardiovascular: The ascending thoracic aorta is mildly dilated and measures 4 cm in maximal measured diameter. The aortic root is normal in caliber and measures 3.4-3.6 cm. The proximal arch measures 3.8 cm and the distal arch 2.8 cm. The descending thoracic aorta measures 2.6 cm. No evidence of  aortic dissection. Proximal great vessels are normally patent and demonstrate bovine branching anatomy. The pulmonary arteries are also well opacified and demonstrates normal caliber without visible pulmonary embolism. Mediastinum/Nodes: No evidence of mediastinal lymphadenopathy or mass. Mildly prominent right hilar lymph node measures approximately 1.5 cm. Unremarkable thyroid gland, of CT appearance of the esophagus and trachea. Lungs/Pleura: Tunneled pleural drainage catheter enters the lateral pleural space and ascends posteriorly. There is a small amount of loculated pleural fluid present in the pleural space with associated pleural thickening. Parenchymal atelectasis and scarring present with lack of complete expansion of the right lung. There are multiple bilateral ill-defined pulmonary nodules in both upper and lower lobes. These are scattered predominantly in a peripheral distribution and are all approximately 7 mm or less in size. These are not smoothly demarcated nodules and may be inflammatory in nature. However, metastatic disease cannot be excluded and correlation suggested with any known diagnosis of malignancy. No pneumothorax, pulmonary edema or significant focal airspace consolidation is identified. Upper Abdomen: Focal hypervascular focus in a relatively arterial phase of imaging in the liver in segment III measures approximately 1.7 cm in greatest diameter. Differential considerations include hemangioma, perfusion anomaly or hypervascular mass. No other focal abnormalities are identified in the visualized upper to mid liver. Musculoskeletal: No fractures or focal bony lesions identified. IMPRESSION: 1. Mildly dilated ascending thoracic aorta measures 4 cm in greatest diameter. Recommend annual imaging followup by CTA or MRA. This recommendation follows 2010 ACCF/AHA/AATS/ACR/ASA/SCA/SCAI/SIR/STS/SVM Guidelines for the Diagnosis and Management of Patients with Thoracic Aortic Disease.  Circulation. 2010; 121: Y248-G500. Aortic aneurysm NOS (ICD10-I71.9) 2. Mildly enlarged right hilar lymph node measures 1.5 cm in short axis. 3. Small amount of loculated right pleural fluid with associated pleural thickening and restricted expansion of the right lung. Indwelling PleurX catheter present. 4. Multiple ill-defined bilateral small pulmonary nodules all measuring under 1 cm. These may be inflammatory in nature based on appearance. However, metastatic disease is not excluded and correlation suggested with any known diagnosis of malignancy. 5. 1.7 cm hypervascular lesion in segment III of the liver with differential including hemangioma, perfusion anomaly or hypervascular mass. Correlation suggested with any prior abdominal CT or MRI imaging. Aortic aneurysm NOS (ICD10-I71.9). Electronically Signed   By: Jenness Corner.D.  On: 01/06/2019 14:19    Procedures .Critical Care Performed by: Kinnie Feil, PA-C Authorized by: Kinnie Feil, PA-C   Critical care provider statement:    Critical care time (minutes):  45   Critical care was necessary to treat or prevent imminent or life-threatening deterioration of the following conditions:  Respiratory failure (multiple consults including OSH consult, med and IR consult, frequent reassessments)   Critical care was time spent personally by me on the following activities:  Discussions with consultants, evaluation of patient's response to treatment, examination of patient, ordering and performing treatments and interventions, ordering and review of laboratory studies, ordering and review of radiographic studies, pulse oximetry, re-evaluation of patient's condition, obtaining history from patient or surrogate, review of old charts and development of treatment plan with patient or surrogate   I assumed direction of critical care for this patient from another provider in my specialty: no     (including critical care time)  Medications  Ordered in ED Medications  morphine 4 MG/ML injection 4 mg (4 mg Intravenous Given 01/06/19 1146)  iohexol (OMNIPAQUE) 300 MG/ML solution 75 mL (75 mLs Intravenous Contrast Given 01/06/19 1400)     Initial Impression / Assessment and Plan / ED Course  I have reviewed the triage vital signs and the nursing notes.  Pertinent labs & imaging results that were available during my care of the patient were reviewed by me and considered in my medical decision making (see chart for details).  Pt's EMR reviewed to obtain PMH.  Admitted [facility] 7/19-7/30 for right pleural effusion/hemithorax, pneumonia s/p cefepime/azithromycin, s/p several thoracentesis, pleural drainage tube, PNA, right pleural effusion. Previous imaging shows multiple lung nodules.  Aspirate of pleural fluid suspicious for malignancy but non diagnostic.  Oncology was consulted and recommended pleuroscopy as outpatient.   Highest on ddx is recurrent symptomatic pleural effusion vs superimposed PNA vs COVID. He has risk for PE given recent prolonged hospitalization but this is less likely given all other symptomatology.  No fever. No h/o HF or peripheral edema. ACS is also unlikely.   ER work up reviewed by me, remarkable for large R pleural effusion with possible superimposed infiltrate/atelectasis.  No leukocytosis. EKG w/o ischemia. Trop 4 > 5.  COVID negative.   Patient re-evaluated. He became hypoxemia with minimal exertion SpO2 90% and tachypnic in the mid 75s.  He is winded with conversation.   Will obtain CT chest to better evaluate.  Given no fever, leukocytosis will hold off on antibiotics.    Will likely need admission and IR consul for possible thoracentesis   1628: Patient has been admitted by medicine.  [facility] hospitalist Dr. 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  I discussed this with patient.  I spoke to IR who agrees with need for therapeutic thoracentesis,  likely tomorrow.  I have placed order for this.  There is been no clinical/respiratory decline on reevaluation.  Discussed with EDP. Final Clinical Impressions(s) / ED Diagnoses   Final diagnoses:  Hypoxemia  Pleural effusion    ED Discharge Orders    None       Arlean Hopping 01/06/19 1630    Tegeler, Gwenyth Allegra, MD 01/07/19 [phone]

## 2019-01-07 ENCOUNTER — Encounter (HOSPITAL_COMMUNITY): Payer: Self-pay

## 2019-01-07 ENCOUNTER — Inpatient Hospital Stay (HOSPITAL_COMMUNITY): Payer: No Typology Code available for payment source

## 2019-01-07 DIAGNOSIS — C3490 Malignant neoplasm of unspecified part of unspecified bronchus or lung: Secondary | ICD-10-CM

## 2019-01-07 DIAGNOSIS — Z7189 Other specified counseling: Secondary | ICD-10-CM

## 2019-01-07 DIAGNOSIS — R0902 Hypoxemia: Secondary | ICD-10-CM

## 2019-01-07 DIAGNOSIS — J910 Malignant pleural effusion: Secondary | ICD-10-CM

## 2019-01-07 LAB — CBC
HCT: 38.3 % — ABNORMAL LOW (ref 39.0–52.0)
HCT: 35.9 % — ABNORMAL LOW (ref 39.0–52.0)
Hemoglobin: 12.6 g/dL — ABNORMAL LOW (ref 13.0–17.0)
Hemoglobin: 12 g/dL — ABNORMAL LOW (ref 13.0–17.0)
MCH: 27.6 pg (ref 26.0–34.0)
MCH: 27.5 pg (ref 26.0–34.0)
MCHC: 32.9 g/dL (ref 30.0–36.0)
MCHC: 33.4 g/dL (ref 30.0–36.0)
MCV: 83.8 fL (ref 80.0–100.0)
MCV: 82.2 fL (ref 80.0–100.0)
Platelets: 441 10*3/uL — ABNORMAL HIGH (ref 150–400)
Platelets: 433 10*3/uL — ABNORMAL HIGH (ref 150–400)
RBC: 4.57 MIL/uL (ref 4.22–5.81)
RBC: 4.37 MIL/uL (ref 4.22–5.81)
RDW: 13.8 % (ref 11.5–15.5)
RDW: 13.7 % (ref 11.5–15.5)
WBC: 11.5 10*3/uL — ABNORMAL HIGH (ref 4.0–10.5)
WBC: 11.8 10*3/uL — ABNORMAL HIGH (ref 4.0–10.5)
nRBC: 0 % (ref 0.0–0.2)
nRBC: 0 % (ref 0.0–0.2)

## 2019-01-07 LAB — COMPREHENSIVE METABOLIC PANEL
ALT: 37 U/L (ref 0–44)
ALT: 35 U/L (ref 0–44)
AST: 32 U/L (ref 15–41)
AST: 27 U/L (ref 15–41)
Albumin: 3.1 g/dL — ABNORMAL LOW (ref 3.5–5.0)
Albumin: 3.1 g/dL — ABNORMAL LOW (ref 3.5–5.0)
Alkaline Phosphatase: 78 U/L (ref 38–126)
Alkaline Phosphatase: 96 U/L (ref 38–126)
Anion gap: 16 — ABNORMAL HIGH (ref 5–15)
Anion gap: 13 (ref 5–15)
BUN: 9 mg/dL (ref 6–20)
BUN: 14 mg/dL (ref 6–20)
CO2: 24 mmol/L (ref 22–32)
CO2: 27 mmol/L (ref 22–32)
Calcium: 9.2 mg/dL (ref 8.9–10.3)
Calcium: 9 mg/dL (ref 8.9–10.3)
Chloride: 90 mmol/L — ABNORMAL LOW (ref 98–111)
Chloride: 89 mmol/L — ABNORMAL LOW (ref 98–111)
Creatinine, Ser: 0.92 mg/dL (ref 0.61–1.24)
Creatinine, Ser: 1.03 mg/dL (ref 0.61–1.24)
GFR calc Af Amer: >60 mL/min (ref >60)
GFR calc Af Amer: >60 mL/min (ref >60)
GFR calc non Af Amer: >60 mL/min (ref >60)
GFR calc non Af Amer: >60 mL/min (ref >60)
Glucose, Bld: 116 mg/dL — ABNORMAL HIGH (ref 70–99)
Glucose, Bld: 163 mg/dL — ABNORMAL HIGH (ref 70–99)
Potassium: 3.7 mmol/L (ref 3.5–5.1)
Potassium: 3.9 mmol/L (ref 3.5–5.1)
Sodium: 130 mmol/L — ABNORMAL LOW (ref 135–145)
Sodium: 129 mmol/L — ABNORMAL LOW (ref 135–145)
Total Bilirubin: 0.4 mg/dL (ref 0.3–1.2)
Total Bilirubin: 0.2 mg/dL — ABNORMAL LOW (ref 0.3–1.2)
Total Protein: 6.8 g/dL (ref 6.5–8.1)
Total Protein: 6.6 g/dL (ref 6.5–8.1)

## 2019-01-07 LAB — BLOOD GAS, ARTERIAL
Acid-Base Excess: 5 mmol/L — ABNORMAL HIGH (ref 0.0–2.0)
Bicarbonate: 28.8 mmol/L — ABNORMAL HIGH (ref 20.0–28.0)
Drawn by: 53527
O2 Content: 2 L/min
O2 Saturation: 97.7 %
Patient temperature: 98.3
pCO2 arterial: 41 mmHg (ref 32.0–48.0)
pH, Arterial: 7.46 — ABNORMAL HIGH (ref 7.350–7.450)
pO2, Arterial: 101 mmHg (ref 83.0–108.0)

## 2019-01-07 LAB — GLUCOSE, CAPILLARY
Glucose-Capillary: 114 mg/dL — ABNORMAL HIGH (ref 70–99)
Glucose-Capillary: 155 mg/dL — ABNORMAL HIGH (ref 70–99)
Glucose-Capillary: 112 mg/dL — ABNORMAL HIGH (ref 70–99)
Glucose-Capillary: 130 mg/dL — ABNORMAL HIGH (ref 70–99)

## 2019-01-07 LAB — URINALYSIS, ROUTINE W REFLEX MICROSCOPIC
Bilirubin Urine: NEGATIVE
Glucose, UA: NEGATIVE mg/dL
Hgb urine dipstick: NEGATIVE
Ketones, ur: 5 mg/dL — AB
Leukocytes,Ua: NEGATIVE
Nitrite: NEGATIVE
Protein, ur: NEGATIVE mg/dL
Specific Gravity, Urine: 1.04 — ABNORMAL HIGH (ref 1.005–1.030)
pH: 5 (ref 5.0–8.0)

## 2019-01-07 LAB — TYPE AND SCREEN
ABO/RH(D): B POS
Antibody Screen: NEGATIVE

## 2019-01-07 LAB — APTT: aPTT: 30 seconds (ref 24–36)

## 2019-01-07 LAB — PROTIME-INR
INR: 1.1 (ref 0.8–1.2)
Prothrombin Time: 14.1 seconds (ref 11.4–15.2)

## 2019-01-07 LAB — HIV ANTIBODY (ROUTINE TESTING W REFLEX): HIV Screen 4th Generation wRfx: NONREACTIVE

## 2019-01-07 LAB — LIPASE, BLOOD: Lipase: 32 U/L (ref 11–51)

## 2019-01-07 MED ORDER — ACETAMINOPHEN 650 MG RE SUPP: 650.0000 mg | Freq: Four times a day (QID) | RECTAL | Status: DC

## 2019-01-07 MED ORDER — IOHEXOL 300 MG/ML  SOLN
100.0000 mL | Freq: Once | INTRAMUSCULAR | Status: AC | PRN
  Administered 2019-01-07: 100 mL via INTRAVENOUS

## 2019-01-07 MED ORDER — GLYCERIN (LAXATIVE) 2.1 G RE SUPP
1.0000 | RECTAL | Status: DC | PRN
  Filled 2019-01-07: qty 1

## 2019-01-07 MED ORDER — IBUPROFEN 200 MG PO TABS
600.0000 mg | ORAL_TABLET | Freq: Four times a day (QID) | ORAL | Status: DC
  Administered 2019-01-07 – 2019-01-13 (×18): 600 mg via ORAL
  Filled 2019-01-07: qty 1
  Filled 2019-01-07 (×3): qty 3
  Filled 2019-01-07: qty 1
  Filled 2019-01-07 (×11): qty 3
  Filled 2019-01-07: qty 1
  Filled 2019-01-07 (×2): qty 3

## 2019-01-07 MED ORDER — POLYETHYLENE GLYCOL 3350 17 G PO PACK
17.0000 g | PACK | Freq: Every day | ORAL | Status: DC
  Administered 2019-01-12: 17 g via ORAL
  Filled 2019-01-07 (×6): qty 1

## 2019-01-07 MED ORDER — CEFAZOLIN SODIUM-DEXTROSE 2-4 GM/100ML-% IV SOLN
2.0000 g | INTRAVENOUS | Status: AC
  Administered 2019-01-08: 08:00:00 2 g via INTRAVENOUS

## 2019-01-07 MED ORDER — HYDROCODONE-ACETAMINOPHEN 5-325 MG PO TABS
1.0000 | ORAL_TABLET | Freq: Four times a day (QID) | ORAL | Status: DC | PRN
  Administered 2019-01-07: 1 via ORAL
  Filled 2019-01-07: qty 2

## 2019-01-07 MED ORDER — ACETAMINOPHEN 325 MG PO TABS: 650.0000 mg | ORAL_TABLET | Freq: Four times a day (QID) | ORAL | Status: DC

## 2019-01-07 MED ORDER — SENNA 8.6 MG PO TABS
1.0000 | ORAL_TABLET | Freq: Two times a day (BID) | ORAL | Status: DC
  Filled 2019-01-07 (×2): qty 1

## 2019-01-07 MED ORDER — HYDROCODONE-ACETAMINOPHEN 5-325 MG PO TABS
2.0000 | ORAL_TABLET | ORAL | Status: DC
  Administered 2019-01-07 – 2019-01-08 (×4): 2 via ORAL
  Filled 2019-01-07 (×4): qty 2

## 2019-01-07 NOTE — Consult Note (Signed)
Reason for Consult:Malignant pleural effusion Referring Physician: Family Med  Andrew Davenport is an 63 y.o. male.  HPI: 63 yo man presents with shortness of breath  Andrew Davenport is a 63 yo man with a history of reflux and tobacco abuse who presents with a c/o shortness of breath. Recently admitted to [facility] with similar complaints. Diagnosed with pneumonia a little over a month ago. Treated with antibiotics with initial improvement but rapid recurrence of symptoms. CXR and then CT demonstrated right pleural effusion. Underwent multiple thoracentesis and placement of a pleural catheter. Cytology x 3. 2 suspicious, 3rd showed malignant cells but insufficient to further characterize.  Discharged 7/30. Presented here yesterday with right sided pleuritic pain and worsening shortness of breath. Seen in consultation by Oncology. They need more tissue for additional studies to guide appropriate therapy.  Drained 300 ml from pleural cath earlier today, stopped due to discomfort  Past Medical History:  Diagnosis Date  . GERD (gastroesophageal reflux disease)     No past surgical history on file.  History reviewed. No pertinent family history.  Social History:  reports that he has been smoking. He has a 10.75 pack-year smoking history. He has never used smokeless tobacco. He reports that he does not drink alcohol or use drugs.  Allergies: No Known Allergies  Medications:  Prior to Admission:  Medications Prior to Admission  Medication Sig Dispense Refill Last Dose  . metFORMIN (GLUCOPHAGE-XR) 750 MG 24 hr tablet Take 750 mg by mouth daily with breakfast.   01/05/2019 at Unknown time  . pantoprazole (PROTONIX) 40 MG tablet Take 40 mg by mouth daily.   01/05/2019 at Unknown time    Results for orders placed or performed during the hospital encounter of 01/06/19 (from the past 48 hour(s))  Basic metabolic panel     Status: Abnormal   Collection Time: 01/06/19  8:57 AM  Result Value Ref Range    Sodium 131 (L) 135 - 145 mmol/L   Potassium 3.7 3.5 - 5.1 mmol/L   Chloride 89 (L) 98 - 111 mmol/L   CO2 27 22 - 32 mmol/L   Glucose, Bld 131 (H) 70 - 99 mg/dL   BUN 8 6 - 20 mg/dL   Creatinine, Ser 0.91 0.61 - 1.24 mg/dL   Calcium 9.4 8.9 - 10.3 mg/dL   GFR calc non Af Amer >60 >60 mL/min   GFR calc Af Amer >60 >60 mL/min   Anion gap 15 5 - 15    Comment: Performed at [facility], [place] [address] [place], [place] [address]  CBC with Differential     Status: Abnormal   Collection Time: 01/06/19  8:57 AM  Result Value Ref Range   WBC 10.3 4.0 - 10.5 K/uL   RBC 5.28 4.22 - 5.81 MIL/uL   Hemoglobin 14.4 13.0 - 17.0 g/dL   HCT 44.3 39.0 - 52.0 %   MCV 83.9 80.0 - 100.0 fL   MCH 27.3 26.0 - 34.0 pg   MCHC 32.5 30.0 - 36.0 g/dL   RDW 13.8 11.5 - 15.5 %   Platelets 392 150 - 400 K/uL   nRBC 0.0 0.0 - 0.2 %   Neutrophils Relative % 68 %   Neutro Abs 7.0 1.7 - 7.7 K/uL   Lymphocytes Relative 20 %   Lymphs Abs 2.1 0.7 - 4.0 K/uL   Monocytes Relative 9 %   Monocytes Absolute 0.9 0.1 - 1.0 K/uL   Eosinophils Relative 1 %   Eosinophils Absolute  0.1 0.0 - 0.5 K/uL   Basophils Relative 0 %   Basophils Absolute 0.0 0.0 - 0.1 K/uL   Immature Granulocytes 2 %   Abs Immature Granulocytes 0.18 (H) 0.00 - 0.07 K/uL    Comment: Performed at [facility] [address] [place], [place] [address]  Troponin I (High Sensitivity)     Status: None   Collection Time: 01/06/19  8:57 AM  Result Value Ref Range   Troponin I (High Sensitivity) 4 <18 ng/L    Comment: (NOTE) Elevated high sensitivity troponin I (hsTnI) values and significant  changes across serial measurements may suggest ACS but many other  chronic and acute conditions are known to elevate hsTnI results.  Refer to the "Links" section for chest pain algorithms and additional  guidance. Performed at [facility], [place] [address], [place], [place] [address]   SARS Coronavirus 2 [facility] order, Performed in [facility] hospital lab) Nasopharyngeal Nasopharyngeal Swab     Status: None   Collection Time: 01/06/19 10:02 AM   Specimen: Nasopharyngeal Swab  Result Value Ref Range   SARS Coronavirus 2 NEGATIVE NEGATIVE    Comment: (NOTE) If result is NEGATIVE SARS-CoV-2 target nucleic acids are NOT DETECTED. The SARS-CoV-2 RNA is generally detectable in upper and lower  respiratory specimens during the acute phase of infection. The lowest  concentration of SARS-CoV-2 viral copies this assay can detect is 250  copies / mL. A negative result does not preclude SARS-CoV-2 infection  and should not be used as the sole basis for treatment or other  patient management decisions.  A negative result may occur with  improper specimen collection / handling, submission of specimen other  than nasopharyngeal swab, presence of viral mutation(s) within the  areas targeted by this assay, and inadequate number of viral copies  (<250 copies / mL). A negative result must be combined with clinical  observations, patient history, and epidemiological information. If result is POSITIVE SARS-CoV-2 target nucleic acids are DETECTED. The SARS-CoV-2 RNA is generally detectable in upper and lower  respiratory specimens dur ing the acute phase of infection.  Positive  results are indicative of active infection with SARS-CoV-2.  Clinical  correlation with patient history and other diagnostic information is  necessary to determine patient infection status.  Positive results do  not rule out bacterial infection or co-infection with other viruses. If result is PRESUMPTIVE POSTIVE SARS-CoV-2 nucleic acids MAY BE PRESENT.   A presumptive positive result was obtained on the submitted specimen  and confirmed on repeat testing.  While 2019 novel coronavirus  (SARS-CoV-2) nucleic acids may be present in the submitted sample  additional confirmatory testing may be necessary for epidemiological  and / or clinical management purposes  to  differentiate between  SARS-CoV-2 and other Sarbecovirus currently known to infect humans.  If clinically indicated additional testing with an alternate test  methodology [id number]) is advised. The SARS-CoV-2 RNA is generally  detectable in upper and lower respiratory sp ecimens during the acute  phase of infection. The expected result is Negative. Fact Sheet for Patients:  StrictlyIdeas.no Fact Sheet for Healthcare Providers: BankingDealers.co.za This test is not yet approved or cleared by the [place] FDA and has been authorized for detection and/or diagnosis of SARS-CoV-2 by FDA under an Emergency Use Authorization (EUA).  This EUA will remain in effect (meaning this test can be used) for the duration of the COVID-19 declaration under Section 564(b)(1) of the Act, 21 U.S.C. section 360bbb-3(b)(1),  unless the authorization is terminated or revoked sooner. Performed at [facility], [place] [address] [place], [place] [address]   Troponin I (High Sensitivity)     Status: None   Collection Time: 01/06/19 10:57 AM  Result Value Ref Range   Troponin I (High Sensitivity) 5 <18 ng/L    Comment: (NOTE) Elevated high sensitivity troponin I (hsTnI) values and significant  changes across serial measurements may suggest ACS but many other  chronic and acute conditions are known to elevate hsTnI results.  Refer to the "Links" section for chest pain algorithms and additional  guidance. Performed at [facility], [place] [address], [place], [place] [address]   Glucose, capillary     Status: Abnormal   Collection Time: 01/06/19 10:36 PM  Result Value Ref Range   Glucose-Capillary 125 (H) 70 - 99 mg/dL  Comprehensive metabolic panel     Status: Abnormal   Collection Time: 01/07/19  6:17 AM  Result Value Ref Range   Sodium 130 (L) 135 - 145 mmol/L   Potassium 3.7 3.5 - 5.1 mmol/L   Chloride 90 (L) 98 - 111 mmol/L   CO2 24 22 - 32  mmol/L   Glucose, Bld 116 (H) 70 - 99 mg/dL   BUN 9 6 - 20 mg/dL   Creatinine, Ser 0.92 0.61 - 1.24 mg/dL   Calcium 9.2 8.9 - 10.3 mg/dL   Total Protein 6.8 6.5 - 8.1 g/dL   Albumin 3.1 (L) 3.5 - 5.0 g/dL   AST 32 15 - 41 U/L   ALT 37 0 - 44 U/L   Alkaline Phosphatase 78 38 - 126 U/L   Total Bilirubin 0.4 0.3 - 1.2 mg/dL   GFR calc non Af Amer >60 >60 mL/min   GFR calc Af Amer >60 >60 mL/min   Anion gap 16 (H) 5 - 15    Comment: Performed at [facility], [place] [address] [place], [place] [address]  CBC     Status: Abnormal   Collection Time: 01/07/19  6:17 AM  Result Value Ref Range   WBC 11.5 (H) 4.0 - 10.5 K/uL   RBC 4.57 4.22 - 5.81 MIL/uL   Hemoglobin 12.6 (L) 13.0 - 17.0 g/dL   HCT 38.3 (L) 39.0 - 52.0 %   MCV 83.8 80.0 - 100.0 fL   MCH 27.6 26.0 - 34.0 pg   MCHC 32.9 30.0 - 36.0 g/dL   RDW 13.8 11.5 - 15.5 %   Platelets 441 (H) 150 - 400 K/uL   nRBC 0.0 0.0 - 0.2 %    Comment: Performed at [facility], [place] [address], [place], [place] [address]  HIV Antibody (routine testing w rflx)     Status: None   Collection Time: 01/07/19  6:17 AM  Result Value Ref Range   HIV Screen 4th Generation wRfx Non Reactive Non Reactive    Comment: (NOTE) Performed At: [facility] [address] [place], [place] [id number] Rush Farmer MD HW:[id number]   Glucose, capillary     Status: Abnormal   Collection Time: 01/07/19  7:53 AM  Result Value Ref Range   Glucose-Capillary 114 (H) 70 - 99 mg/dL  Glucose, capillary     Status: Abnormal   Collection Time: 01/07/19 11:43 AM  Result Value Ref Range   Glucose-Capillary 155 (H) 70 - 99 mg/dL    Dg Chest 2 View  Result Date: 01/06/2019 CLINICAL DATA:  Shortness of breath. Chest pain. Pleural effusion.  Chest tube. EXAM: CHEST - 2 VIEW COMPARISON:  No recent prior. FINDINGS: Heart size normal. Right base atelectasis/infiltrate and right-sided pleural effusion. Right chest tube noted with tip over the right lower  chest. No pneumothorax. Mild patchy infiltrates in the left lung cannot be excluded. Degenerative change thoracic spine. IMPRESSION: 1. Right base atelectasis/infiltrate right-sided pleural effusion. Right chest tube noted with tip over the right lower chest. No pneumothorax. 2. Mild patchy infiltrates in left lung cannot be excluded. Electronically Signed   By: Marcello Moores  Register   On: 01/06/2019 09:27   Ct Chest W Contrast  Result Date: 01/06/2019 CLINICAL DATA:  Chest pain, shortness of breath, back pain and recent history of right pleural effusion with tunneled PleurX catheter placement at outside institution. Chest x-ray demonstrates component of right-sided pleural fluid and pulmonary airspace opacity. EXAM: CT CHEST WITH CONTRAST TECHNIQUE: Multidetector CT imaging of the chest was performed during intravenous contrast administration. CONTRAST:  11mL OMNIPAQUE IOHEXOL 300 MG/ML  SOLN COMPARISON:  Chest x-ray earlier today. FINDINGS: Cardiovascular: The ascending thoracic aorta is mildly dilated and measures 4 cm in maximal measured diameter. The aortic root is normal in caliber and measures 3.4-3.6 cm. The proximal arch measures 3.8 cm and the distal arch 2.8 cm. The descending thoracic aorta measures 2.6 cm. No evidence of aortic dissection. Proximal great vessels are normally patent and demonstrate bovine branching anatomy. The pulmonary arteries are also well opacified and demonstrates normal caliber without visible pulmonary embolism. Mediastinum/Nodes: No evidence of mediastinal lymphadenopathy or mass. Mildly prominent right hilar lymph node measures approximately 1.5 cm. Unremarkable thyroid gland, of CT appearance of the esophagus and trachea. Lungs/Pleura: Tunneled pleural drainage catheter enters the lateral pleural space and ascends posteriorly. There is a small amount of loculated pleural fluid present in the pleural space with associated pleural thickening. Parenchymal atelectasis and scarring  present with lack of complete expansion of the right lung. There are multiple bilateral ill-defined pulmonary nodules in both upper and lower lobes. These are scattered predominantly in a peripheral distribution and are all approximately 7 mm or less in size. These are not smoothly demarcated nodules and may be inflammatory in nature. However, metastatic disease cannot be excluded and correlation suggested with any known diagnosis of malignancy. No pneumothorax, pulmonary edema or significant focal airspace consolidation is identified. Upper Abdomen: Focal hypervascular focus in a relatively arterial phase of imaging in the liver in segment III measures approximately 1.7 cm in greatest diameter. Differential considerations include hemangioma, perfusion anomaly or hypervascular mass. No other focal abnormalities are identified in the visualized upper to mid liver. Musculoskeletal: No fractures or focal bony lesions identified. IMPRESSION: 1. Mildly dilated ascending thoracic aorta measures 4 cm in greatest diameter. Recommend annual imaging followup by CTA or MRA. This recommendation follows 2010 ACCF/AHA/AATS/ACR/ASA/SCA/SCAI/SIR/STS/SVM Guidelines for the Diagnosis and Management of Patients with Thoracic Aortic Disease. Circulation. 2010; 121: Z358-I518. Aortic aneurysm NOS (ICD10-I71.9) 2. Mildly enlarged right hilar lymph node measures 1.5 cm in short axis. 3. Small amount of loculated right pleural fluid with associated pleural thickening and restricted expansion of the right lung. Indwelling PleurX catheter present. 4. Multiple ill-defined bilateral small pulmonary nodules all measuring under 1 cm. These may be inflammatory in nature based on appearance. However, metastatic disease is not excluded and correlation suggested with any known diagnosis of malignancy. 5. 1.7 cm hypervascular lesion in segment III of the liver with differential including hemangioma, perfusion anomaly or hypervascular mass.  Correlation suggested with any prior abdominal CT or MRI  imaging. Aortic aneurysm NOS (ICD10-I71.9). Electronically Signed   By: Aletta Edouard M.D.   On: 01/06/2019 14:19   I personally reviewed the CT images and concur with the findings noted above  Review of Systems  Constitutional: Positive for fever and malaise/fatigue.  Respiratory: Positive for cough, sputum production, shortness of breath and wheezing.   Cardiovascular: Positive for chest pain and orthopnea. Negative for leg swelling.  Gastrointestinal: Positive for nausea and vomiting.  Musculoskeletal: Positive for back pain.  Neurological: Positive for dizziness.  All other systems reviewed and are negative.  Blood pressure 124/88, pulse 98, temperature 97.9 F (36.6 C), temperature source Oral, resp. rate 17, height 6\' 1"  (1.854 m), weight 110.7 kg, SpO2 100 %. Physical Exam  Vitals reviewed. Constitutional: He is oriented to person, place, and time. He appears well-developed and well-nourished. No distress.  HENT:  Head: Normocephalic and atraumatic.  Mouth/Throat: No oropharyngeal exudate.  Eyes: Conjunctivae and EOM are normal. No scleral icterus.  Neck: Neck supple.  Cardiovascular: Normal rate, regular rhythm and normal heart sounds.  No murmur heard. Respiratory: No respiratory distress. He has no wheezes. He has no rales.  Diminished BS on right  GI: Soft. He exhibits no distension. There is no abdominal tenderness.  Musculoskeletal:        General: No edema.  Lymphadenopathy:    He has no cervical adenopathy.  Neurological: He is alert and oriented to person, place, and time. No cranial nerve deficit. He exhibits normal muscle tone.  Skin: Skin is warm and dry.    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 Unfortunately there is not sufficient material from cytology to determine cell type. Needs additional tissue for path. Given CT findings  I think the best option to obtain additional tissue is to do a right VATS for pleural biopsy.   I discussed the general nature of the procedure, the need for general anesthesia, and the incisions to be used with Andrew Davenport. We discussed the expected hospital stay, overall recovery and short and long term outcomes. I informed him of the indications, risks, benefits and alternatives. He understands the risks include, but are not limited to death, stroke, MI, DVT/PE, bleeding, possible need for transfusion, infections, air leak, cardiac arrhythmias, as well as other organ system dysfunction including respiratory, renal, or GI complications.   He knows the procedure will not significantly improve his respiratory status.  He accepts the risks and agrees to proceed.  Plan OR for Right VATS, pleural biopsy tomorrow AM  [name] 01/07/2019, 1:42 PM

## 2019-01-07 NOTE — Progress Notes (Addendum)
Family Medicine Teaching Service Daily Progress Note Intern Pager: [phone]  Patient name: Andrew Davenport Medical record number: [id number] Date of birth: 03-31-1956 Age: 63 y.o. Gender: male  Primary Care Provider: Patient, No Pcp Per Consultants: IR Code Status: Full  Pt Overview and Major Events to Date:  Andrew Davenport is a 63 y.o. male presenting with progressive SOB and right-sided chest pain. PMH is significant for small cell carcinoma, recurrent pleural effusions requiring thoracocentesis and type 2 diabetes.  Assessment and Plan: Hypoxemic respiratory failure 2/2 non-small cell carcinoma/malignant pleural effusions Patient still reports dyspnea, but improved from admission with oxygen.  Chest exam: crackles from mid to right lung base reduced air entry throughout, left lung good air entry no crackles or wheeze.  reports increased work of breathing. Sats 100% on room air Last drained 300 mL yesterday afternoon-and unsure what time proximately 3/4 p.m. Drained minimal amount this morning. -IR regarding further Pleurx management -Awaiting CVTS recs for pleurX management and possible biopsies -Oncology-appreciate recs            -MRI brain with contrast            -CT abdo pelvis with contrast  -Palliative consult  -Continue to monitor and document Pleurx fluid amount and color -Continue to monitor vitals  Pain 2/2 to malignancy/pleural effusions Patient is experiencing pressure type pain in the right side of chest, draining pleural fluid and hydrocodone alleviates pain.  Reports lidocaine patch fell off last night -Continue hydrocodone- acetaminophen 5-325 1-2 tabs Q6PRN -Continue ibuprofen 600 mg Q6Hrly  -Continue Tylenol 650 mg q. 6PRN -Monitor pain -I will review pt this afternoon and ask whether his pain is adequately controlled with above pain regime, if not I will adjust his medications    Type 2 diabetes mellitus, well-controlled  Recent diagnosis. HbA1c 7.4  12/21/2018 Home meds: metformin 750mg  daily. CBG 114 on 8/5  -Continue to monitor CBGs -Start sensitive sliding scale  Hyponatremia -Na 130 today, 131 on 8/5 -Continue to monitor with BMP  Leukocytosis, likely secondary to malignancy WBC 11.5 today, 10.3 on 8/5  GERD Home meds 40 mg pantoprazole -Continue pantoprazole  FEN/GI: Normal diet, Protonix PPx: Lovenox  Disposition: Management of pleural effusions  Subjective:  Patient reports shortness of breath is improved a little with oxygen however still persistent.  Right-sided lower and right-sided back and chest pain still present, improving with hydrocodone and with draining the fluid.  Fluid drained this morning, yellow straw-colored, no blood in pleural fluid. Patient is requesting drain kits as has run out.  Patient denies fevers, dizziness or abdominal pain.  Denies urinary symptoms and is having active bowel movements.  Has informed his family members of his cancer diagnosis.  Explained that this is the beginning of the diagnosis and management of his cancer and multiple specialists be involved in his care.  We have ordered a CT abdomen and pelvis in order to check for metastasis I explained to the patient he was concerned of the cancer spreading yesterday.   Objective: Temp:  [97.5 F (36.4 C)-97.9 F (36.6 C)] 97.5 F (36.4 C) (08/05 2241) Pulse Rate:  [93-112] 101 (08/05 2241) Resp:  [16-26] 16 (08/05 2241) BP: (110-150)/(70-94) 116/73 (08/05 2241) SpO2:  [94 %-100 %] 100 % (08/05 2241) Weight:  [110.7 kg] 110.7 kg (08/05 0833)  General: Alert, cooperative and appears to be in no acute distress, nasal cannula with oxygen HEENT: Neck non-tender without lymphadenopathy, masses or thyromegaly Cardio: Normal S1 and S2, no  S3 or S4. Rhythm is regular. No murmurs or rubs.   Pulm: PleurX drain in situ. crackles from mid to right lung base reduced air entry throughout, left lung good air entry no crackles or wheeze.  Creased  work of breathing Abdomen: Bowel sounds normal. Abdomen soft and non-tender.  Extremities: No peripheral edema. Warm/ well perfused.  Strong radial pulse. Neuro: Cranial nerves grossly intact  Laboratory: Recent Labs  Lab 01/06/19 0857  WBC 10.3  HGB 14.4  HCT 44.3  PLT 392   Recent Labs  Lab 01/06/19 0857  NA 131*  K 3.7  CL 89*  CO2 27  BUN 8  CREATININE 0.91  CALCIUM 9.4  GLUCOSE 131*      Imaging/Diagnostic Tests: Dg Chest 2 View  Result Date: 01/06/2019 CLINICAL DATA:  Shortness of breath. Chest pain. Pleural effusion. Chest tube. EXAM: CHEST - 2 VIEW COMPARISON:  No recent prior. FINDINGS: Heart size normal. Right base atelectasis/infiltrate and right-sided pleural effusion. Right chest tube noted with tip over the right lower chest. No pneumothorax. Mild patchy infiltrates in the left lung cannot be excluded. Degenerative change thoracic spine. IMPRESSION: 1. Right base atelectasis/infiltrate right-sided pleural effusion. Right chest tube noted with tip over the right lower chest. No pneumothorax. 2. Mild patchy infiltrates in left lung cannot be excluded. Electronically Signed   By: Marcello Moores  Register   On: 01/06/2019 09:27   Ct Chest W Contrast  Result Date: 01/06/2019 CLINICAL DATA:  Chest pain, shortness of breath, back pain and recent history of right pleural effusion with tunneled PleurX catheter placement at outside institution. Chest x-ray demonstrates component of right-sided pleural fluid and pulmonary airspace opacity. EXAM: CT CHEST WITH CONTRAST TECHNIQUE: Multidetector CT imaging of the chest was performed during intravenous contrast administration. CONTRAST:  74mL OMNIPAQUE IOHEXOL 300 MG/ML  SOLN COMPARISON:  Chest x-ray earlier today. FINDINGS: Cardiovascular: The ascending thoracic aorta is mildly dilated and measures 4 cm in maximal measured diameter. The aortic root is normal in caliber and measures 3.4-3.6 cm. The proximal arch measures 3.8 cm and the  distal arch 2.8 cm. The descending thoracic aorta measures 2.6 cm. No evidence of aortic dissection. Proximal great vessels are normally patent and demonstrate bovine branching anatomy. The pulmonary arteries are also well opacified and demonstrates normal caliber without visible pulmonary embolism. Mediastinum/Nodes: No evidence of mediastinal lymphadenopathy or mass. Mildly prominent right hilar lymph node measures approximately 1.5 cm. Unremarkable thyroid gland, of CT appearance of the esophagus and trachea. Lungs/Pleura: Tunneled pleural drainage catheter enters the lateral pleural space and ascends posteriorly. There is a small amount of loculated pleural fluid present in the pleural space with associated pleural thickening. Parenchymal atelectasis and scarring present with lack of complete expansion of the right lung. There are multiple bilateral ill-defined pulmonary nodules in both upper and lower lobes. These are scattered predominantly in a peripheral distribution and are all approximately 7 mm or less in size. These are not smoothly demarcated nodules and may be inflammatory in nature. However, metastatic disease cannot be excluded and correlation suggested with any known diagnosis of malignancy. No pneumothorax, pulmonary edema or significant focal airspace consolidation is identified. Upper Abdomen: Focal hypervascular focus in a relatively arterial phase of imaging in the liver in segment III measures approximately 1.7 cm in greatest diameter. Differential considerations include hemangioma, perfusion anomaly or hypervascular mass. No other focal abnormalities are identified in the visualized upper to mid liver. Musculoskeletal: No fractures or focal bony lesions identified. IMPRESSION: 1. Mildly dilated  ascending thoracic aorta measures 4 cm in greatest diameter. Recommend annual imaging followup by CTA or MRA. This recommendation follows 2010 ACCF/AHA/AATS/ACR/ASA/SCA/SCAI/SIR/STS/SVM Guidelines for  the Diagnosis and Management of Patients with Thoracic Aortic Disease. Circulation. 2010; 121: Q982-M415. Aortic aneurysm NOS (ICD10-I71.9) 2. Mildly enlarged right hilar lymph node measures 1.5 cm in short axis. 3. Small amount of loculated right pleural fluid with associated pleural thickening and restricted expansion of the right lung. Indwelling PleurX catheter present. 4. Multiple ill-defined bilateral small pulmonary nodules all measuring under 1 cm. These may be inflammatory in nature based on appearance. However, metastatic disease is not excluded and correlation suggested with any known diagnosis of malignancy. 5. 1.7 cm hypervascular lesion in segment III of the liver with differential including hemangioma, perfusion anomaly or hypervascular mass. Correlation suggested with any prior abdominal CT or MRI imaging. Aortic aneurysm NOS (ICD10-I71.9). Electronically Signed   By: Aletta Edouard M.D.   On: 01/06/2019 14:19    Lattie Haw, MD 01/07/2019, 6:31 AM PGY-1, [place] Intern pager: [phone], text pages welcome

## 2019-01-07 NOTE — Progress Notes (Signed)
Dr. Posey Pronto spoke with Dr. Maylon Peppers, oncology who is recommending that CVTS perform biopsies on patient in order to get a more definitive diagnosis of his non-small cell carcinoma.    Andrew Ewan Grau, DO PGY-3, [facility]

## 2019-01-07 NOTE — Progress Notes (Signed)
Nutrition Brief Note  Patient identified on the Malnutrition Screening Tool (MST) Report  Patient currently consuming 100% of meals. Per ED notes, pt ate a cheeseburger last night. Pt with insignificant weight loss for time frame.  Wt Readings from Last 15 Encounters:  01/06/19 110.7 kg  04/29/16 125.6 kg  05/03/14 118.6 kg    Body mass index is 32.19 kg/m. Patient meets criteria for obesity based on current BMI.   Current diet order is regular , patient is consuming approximately 100% of meals at this time. Labs and medications reviewed.   No nutrition interventions warranted at this time. If nutrition issues arise, please consult RD.   Clayton Bibles, MS, RD, LDN  Inpatient Clinical Dietitian Pager: [phone] After Hours Pager: [phone]

## 2019-01-07 NOTE — Social Work (Addendum)
CSW received call from [place], [place]. Pt assigned CSW at [facility] is Daisy Floro at [phone] ext [phone]. At current time pt still receiving medical work up. Message left with Marlen.   CSW continuing to follow for support with disposition when medically appropriate.  Westley Hummer, MSW, [place] Work [phone]

## 2019-01-07 NOTE — Plan of Care (Signed)
  Problem: Education: Goal: Knowledge of General Education information will improve Description: Including pain rating scale, medication(s)/side effects and non-pharmacologic comfort measures 01/07/2019 0033 by Jacqulyn Ducking, RN Outcome: Progressing 01/07/2019 0032 by Jacqulyn Ducking, RN Outcome: Progressing   Problem: Health Behavior/Discharge Planning: Goal: Ability to manage health-related needs will improve Outcome: Progressing   Problem: Clinical Measurements: Goal: Will remain free from infection Outcome: Progressing   Problem: Activity: Goal: Risk for activity intolerance will decrease 01/07/2019 0033 by Jacqulyn Ducking, RN Outcome: Progressing 01/07/2019 0032 by Jacqulyn Ducking, RN Outcome: Progressing   Problem: Nutrition: Goal: Adequate nutrition will be maintained 01/07/2019 0033 by Jacqulyn Ducking, RN Outcome: Progressing 01/07/2019 0032 by Jacqulyn Ducking, RN Outcome: Progressing   Problem: Elimination: Goal: Will not experience complications related to bowel motility Outcome: Progressing Goal: Will not experience complications related to urinary retention Outcome: Progressing   Problem: Pain Managment: Goal: General experience of comfort will improve Outcome: Progressing

## 2019-01-07 NOTE — H&P (View-Only) (Signed)
Reason for Consult:Malignant pleural effusion Referring Physician: Family Med  GUILFORD SHANNAHAN is an 63 y.o. male.  HPI: 63 yo man presents with shortness of breath  Mr. Oehlert is a 63 yo man with a history of reflux and tobacco abuse who presents with a c/o shortness of breath. Recently admitted to [facility] with similar complaints. Diagnosed with pneumonia a little over a month ago. Treated with antibiotics with initial improvement but rapid recurrence of symptoms. CXR and then CT demonstrated right pleural effusion. Underwent multiple thoracentesis and placement of a pleural catheter. Cytology x 3. 2 suspicious, 3rd showed malignant cells but insufficient to further characterize.  Discharged 7/30. Presented here yesterday with right sided pleuritic pain and worsening shortness of breath. Seen in consultation by Oncology. They need more tissue for additional studies to guide appropriate therapy.  Drained 300 ml from pleural cath earlier today, stopped due to discomfort  Past Medical History:  Diagnosis Date  . GERD (gastroesophageal reflux disease)     No past surgical history on file.  History reviewed. No pertinent family history.  Social History:  reports that he has been smoking. He has a 10.75 pack-year smoking history. He has never used smokeless tobacco. He reports that he does not drink alcohol or use drugs.  Allergies: No Known Allergies  Medications:  Prior to Admission:  Medications Prior to Admission  Medication Sig Dispense Refill Last Dose  . metFORMIN (GLUCOPHAGE-XR) 750 MG 24 hr tablet Take 750 mg by mouth daily with breakfast.   01/05/2019 at Unknown time  . pantoprazole (PROTONIX) 40 MG tablet Take 40 mg by mouth daily.   01/05/2019 at Unknown time    Results for orders placed or performed during the hospital encounter of 01/06/19 (from the past 48 hour(s))  Basic metabolic panel     Status: Abnormal   Collection Time: 01/06/19  8:57 AM  Result Value Ref Range    Sodium 131 (L) 135 - 145 mmol/L   Potassium 3.7 3.5 - 5.1 mmol/L   Chloride 89 (L) 98 - 111 mmol/L   CO2 27 22 - 32 mmol/L   Glucose, Bld 131 (H) 70 - 99 mg/dL   BUN 8 6 - 20 mg/dL   Creatinine, Ser 0.91 0.61 - 1.24 mg/dL   Calcium 9.4 8.9 - 10.3 mg/dL   GFR calc non Af Amer >60 >60 mL/min   GFR calc Af Amer >60 >60 mL/min   Anion gap 15 5 - 15    Comment: Performed at [facility], [place] [address], [place], [place] [address]  CBC with Differential     Status: Abnormal   Collection Time: 01/06/19  8:57 AM  Result Value Ref Range   WBC 10.3 4.0 - 10.5 K/uL   RBC 5.28 4.22 - 5.81 MIL/uL   Hemoglobin 14.4 13.0 - 17.0 g/dL   HCT 44.3 39.0 - 52.0 %   MCV 83.9 80.0 - 100.0 fL   MCH 27.3 26.0 - 34.0 pg   MCHC 32.5 30.0 - 36.0 g/dL   RDW 13.8 11.5 - 15.5 %   Platelets 392 150 - 400 K/uL   nRBC 0.0 0.0 - 0.2 %   Neutrophils Relative % 68 %   Neutro Abs 7.0 1.7 - 7.7 K/uL   Lymphocytes Relative 20 %   Lymphs Abs 2.1 0.7 - 4.0 K/uL   Monocytes Relative 9 %   Monocytes Absolute 0.9 0.1 - 1.0 K/uL   Eosinophils Relative 1 %   Eosinophils Absolute  0.1 0.0 - 0.5 K/uL   Basophils Relative 0 %   Basophils Absolute 0.0 0.0 - 0.1 K/uL   Immature Granulocytes 2 %   Abs Immature Granulocytes 0.18 (H) 0.00 - 0.07 K/uL    Comment: Performed at [facility] [address] [place], [place] [address]  Troponin I (High Sensitivity)     Status: None   Collection Time: 01/06/19  8:57 AM  Result Value Ref Range   Troponin I (High Sensitivity) 4 <18 ng/L    Comment: (NOTE) Elevated high sensitivity troponin I (hsTnI) values and significant  changes across serial measurements may suggest ACS but many other  chronic and acute conditions are known to elevate hsTnI results.  Refer to the "Links" section for chest pain algorithms and additional  guidance. Performed at [facility], [place] [address], [place], [place] [address]   SARS Coronavirus 2 [facility] order, Performed in [facility] hospital lab) Nasopharyngeal Nasopharyngeal Swab     Status: None   Collection Time: 01/06/19 10:02 AM   Specimen: Nasopharyngeal Swab  Result Value Ref Range   SARS Coronavirus 2 NEGATIVE NEGATIVE    Comment: (NOTE) If result is NEGATIVE SARS-CoV-2 target nucleic acids are NOT DETECTED. The SARS-CoV-2 RNA is generally detectable in upper and lower  respiratory specimens during the acute phase of infection. The lowest  concentration of SARS-CoV-2 viral copies this assay can detect is 250  copies / mL. A negative result does not preclude SARS-CoV-2 infection  and should not be used as the sole basis for treatment or other  patient management decisions.  A negative result may occur with  improper specimen collection / handling, submission of specimen other  than nasopharyngeal swab, presence of viral mutation(s) within the  areas targeted by this assay, and inadequate number of viral copies  (<250 copies / mL). A negative result must be combined with clinical  observations, patient history, and epidemiological information. If result is POSITIVE SARS-CoV-2 target nucleic acids are DETECTED. The SARS-CoV-2 RNA is generally detectable in upper and lower  respiratory specimens dur ing the acute phase of infection.  Positive  results are indicative of active infection with SARS-CoV-2.  Clinical  correlation with patient history and other diagnostic information is  necessary to determine patient infection status.  Positive results do  not rule out bacterial infection or co-infection with other viruses. If result is PRESUMPTIVE POSTIVE SARS-CoV-2 nucleic acids MAY BE PRESENT.   A presumptive positive result was obtained on the submitted specimen  and confirmed on repeat testing.  While 2019 novel coronavirus  (SARS-CoV-2) nucleic acids may be present in the submitted sample  additional confirmatory testing may be necessary for epidemiological  and / or clinical management purposes  to  differentiate between  SARS-CoV-2 and other Sarbecovirus currently known to infect humans.  If clinically indicated additional testing with an alternate test  methodology [phone]) is advised. The SARS-CoV-2 RNA is generally  detectable in upper and lower respiratory sp ecimens during the acute  phase of infection. The expected result is Negative. Fact Sheet for Patients:  StrictlyIdeas.no Fact Sheet for Healthcare Providers: BankingDealers.co.za This test is not yet approved or cleared by the [place] FDA and has been authorized for detection and/or diagnosis of SARS-CoV-2 by FDA under an Emergency Use Authorization (EUA).  This EUA will remain in effect (meaning this test can be used) for the duration of the COVID-19 declaration under Section 564(b)(1) of the Act, 21 U.S.C. section 360bbb-3(b)(1),  unless the authorization is terminated or revoked sooner. Performed at [facility], [place] [address] [place], [place] [address]   Troponin I (High Sensitivity)     Status: None   Collection Time: 01/06/19 10:57 AM  Result Value Ref Range   Troponin I (High Sensitivity) 5 <18 ng/L    Comment: (NOTE) Elevated high sensitivity troponin I (hsTnI) values and significant  changes across serial measurements may suggest ACS but many other  chronic and acute conditions are known to elevate hsTnI results.  Refer to the "Links" section for chest pain algorithms and additional  guidance. Performed at [facility], [place] [address] [place], [place] [address]   Glucose, capillary     Status: Abnormal   Collection Time: 01/06/19 10:36 PM  Result Value Ref Range   Glucose-Capillary 125 (H) 70 - 99 mg/dL  Comprehensive metabolic panel     Status: Abnormal   Collection Time: 01/07/19  6:17 AM  Result Value Ref Range   Sodium 130 (L) 135 - 145 mmol/L   Potassium 3.7 3.5 - 5.1 mmol/L   Chloride 90 (L) 98 - 111 mmol/L   CO2 24 22 - 32  mmol/L   Glucose, Bld 116 (H) 70 - 99 mg/dL   BUN 9 6 - 20 mg/dL   Creatinine, Ser 0.92 0.61 - 1.24 mg/dL   Calcium 9.2 8.9 - 10.3 mg/dL   Total Protein 6.8 6.5 - 8.1 g/dL   Albumin 3.1 (L) 3.5 - 5.0 g/dL   AST 32 15 - 41 U/L   ALT 37 0 - 44 U/L   Alkaline Phosphatase 78 38 - 126 U/L   Total Bilirubin 0.4 0.3 - 1.2 mg/dL   GFR calc non Af Amer >60 >60 mL/min   GFR calc Af Amer >60 >60 mL/min   Anion gap 16 (H) 5 - 15    Comment: Performed at [facility], [place] [address], [place], [place] [address]  CBC     Status: Abnormal   Collection Time: 01/07/19  6:17 AM  Result Value Ref Range   WBC 11.5 (H) 4.0 - 10.5 K/uL   RBC 4.57 4.22 - 5.81 MIL/uL   Hemoglobin 12.6 (L) 13.0 - 17.0 g/dL   HCT 38.3 (L) 39.0 - 52.0 %   MCV 83.8 80.0 - 100.0 fL   MCH 27.6 26.0 - 34.0 pg   MCHC 32.9 30.0 - 36.0 g/dL   RDW 13.8 11.5 - 15.5 %   Platelets 441 (H) 150 - 400 K/uL   nRBC 0.0 0.0 - 0.2 %    Comment: Performed at [facility], [place] [address] [place], [place] [address]  HIV Antibody (routine testing w rflx)     Status: None   Collection Time: 01/07/19  6:17 AM  Result Value Ref Range   HIV Screen 4th Generation wRfx Non Reactive Non Reactive    Comment: (NOTE) Performed At: [facility] [address] [place], [place] [id number] Rush Farmer MD DP:[id number]   Glucose, capillary     Status: Abnormal   Collection Time: 01/07/19  7:53 AM  Result Value Ref Range   Glucose-Capillary 114 (H) 70 - 99 mg/dL  Glucose, capillary     Status: Abnormal   Collection Time: 01/07/19 11:43 AM  Result Value Ref Range   Glucose-Capillary 155 (H) 70 - 99 mg/dL    Dg Chest 2 View  Result Date: 01/06/2019 CLINICAL DATA:  Shortness of breath. Chest pain. Pleural effusion.  Chest tube. EXAM: CHEST - 2 VIEW COMPARISON:  No recent prior. FINDINGS: Heart size normal. Right base atelectasis/infiltrate and right-sided pleural effusion. Right chest tube noted with tip over the right lower  chest. No pneumothorax. Mild patchy infiltrates in the left lung cannot be excluded. Degenerative change thoracic spine. IMPRESSION: 1. Right base atelectasis/infiltrate right-sided pleural effusion. Right chest tube noted with tip over the right lower chest. No pneumothorax. 2. Mild patchy infiltrates in left lung cannot be excluded. Electronically Signed   By: Marcello Moores  Register   On: 01/06/2019 09:27   Ct Chest W Contrast  Result Date: 01/06/2019 CLINICAL DATA:  Chest pain, shortness of breath, back pain and recent history of right pleural effusion with tunneled PleurX catheter placement at outside institution. Chest x-ray demonstrates component of right-sided pleural fluid and pulmonary airspace opacity. EXAM: CT CHEST WITH CONTRAST TECHNIQUE: Multidetector CT imaging of the chest was performed during intravenous contrast administration. CONTRAST:  41mL OMNIPAQUE IOHEXOL 300 MG/ML  SOLN COMPARISON:  Chest x-ray earlier today. FINDINGS: Cardiovascular: The ascending thoracic aorta is mildly dilated and measures 4 cm in maximal measured diameter. The aortic root is normal in caliber and measures 3.4-3.6 cm. The proximal arch measures 3.8 cm and the distal arch 2.8 cm. The descending thoracic aorta measures 2.6 cm. No evidence of aortic dissection. Proximal great vessels are normally patent and demonstrate bovine branching anatomy. The pulmonary arteries are also well opacified and demonstrates normal caliber without visible pulmonary embolism. Mediastinum/Nodes: No evidence of mediastinal lymphadenopathy or mass. Mildly prominent right hilar lymph node measures approximately 1.5 cm. Unremarkable thyroid gland, of CT appearance of the esophagus and trachea. Lungs/Pleura: Tunneled pleural drainage catheter enters the lateral pleural space and ascends posteriorly. There is a small amount of loculated pleural fluid present in the pleural space with associated pleural thickening. Parenchymal atelectasis and scarring  present with lack of complete expansion of the right lung. There are multiple bilateral ill-defined pulmonary nodules in both upper and lower lobes. These are scattered predominantly in a peripheral distribution and are all approximately 7 mm or less in size. These are not smoothly demarcated nodules and may be inflammatory in nature. However, metastatic disease cannot be excluded and correlation suggested with any known diagnosis of malignancy. No pneumothorax, pulmonary edema or significant focal airspace consolidation is identified. Upper Abdomen: Focal hypervascular focus in a relatively arterial phase of imaging in the liver in segment III measures approximately 1.7 cm in greatest diameter. Differential considerations include hemangioma, perfusion anomaly or hypervascular mass. No other focal abnormalities are identified in the visualized upper to mid liver. Musculoskeletal: No fractures or focal bony lesions identified. IMPRESSION: 1. Mildly dilated ascending thoracic aorta measures 4 cm in greatest diameter. Recommend annual imaging followup by CTA or MRA. This recommendation follows 2010 ACCF/AHA/AATS/ACR/ASA/SCA/SCAI/SIR/STS/SVM Guidelines for the Diagnosis and Management of Patients with Thoracic Aortic Disease. Circulation. 2010; 121: F751-W258. Aortic aneurysm NOS (ICD10-I71.9) 2. Mildly enlarged right hilar lymph node measures 1.5 cm in short axis. 3. Small amount of loculated right pleural fluid with associated pleural thickening and restricted expansion of the right lung. Indwelling PleurX catheter present. 4. Multiple ill-defined bilateral small pulmonary nodules all measuring under 1 cm. These may be inflammatory in nature based on appearance. However, metastatic disease is not excluded and correlation suggested with any known diagnosis of malignancy. 5. 1.7 cm hypervascular lesion in segment III of the liver with differential including hemangioma, perfusion anomaly or hypervascular mass.  Correlation suggested with any prior abdominal CT or MRI  imaging. Aortic aneurysm NOS (ICD10-I71.9). Electronically Signed   By: Aletta Edouard M.D.   On: 01/06/2019 14:19   I personally reviewed the CT images and concur with the findings noted above  Review of Systems  Constitutional: Positive for fever and malaise/fatigue.  Respiratory: Positive for cough, sputum production, shortness of breath and wheezing.   Cardiovascular: Positive for chest pain and orthopnea. Negative for leg swelling.  Gastrointestinal: Positive for nausea and vomiting.  Musculoskeletal: Positive for back pain.  Neurological: Positive for dizziness.  All other systems reviewed and are negative.  Blood pressure 124/88, pulse 98, temperature 97.9 F (36.6 C), temperature source Oral, resp. rate 17, height 6\' 1"  (1.854 m), weight 110.7 kg, SpO2 100 %. Physical Exam  Vitals reviewed. Constitutional: He is oriented to person, place, and time. He appears well-developed and well-nourished. No distress.  HENT:  Head: Normocephalic and atraumatic.  Mouth/Throat: No oropharyngeal exudate.  Eyes: Conjunctivae and EOM are normal. No scleral icterus.  Neck: Neck supple.  Cardiovascular: Normal rate, regular rhythm and normal heart sounds.  No murmur heard. Respiratory: No respiratory distress. He has no wheezes. He has no rales.  Diminished BS on right  GI: Soft. He exhibits no distension. There is no abdominal tenderness.  Musculoskeletal:        General: No edema.  Lymphadenopathy:    He has no cervical adenopathy.  Neurological: He is alert and oriented to person, place, and time. No cranial nerve deficit. He exhibits normal muscle tone.  Skin: Skin is warm and dry.    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 Unfortunately there is not sufficient material from cytology to determine cell type. Needs additional tissue for path. Given CT findings  I think the best option to obtain additional tissue is to do a right VATS for pleural biopsy.   I discussed the general nature of the procedure, the need for general anesthesia, and the incisions to be used with Mr. Batch. We discussed the expected hospital stay, overall recovery and short and long term outcomes. I informed him of the indications, risks, benefits and alternatives. He understands the risks include, but are not limited to death, stroke, MI, DVT/PE, bleeding, possible need for transfusion, infections, air leak, cardiac arrhythmias, as well as other organ system dysfunction including respiratory, renal, or GI complications.   He knows the procedure will not significantly improve his respiratory status.  He accepts the risks and agrees to proceed.  Plan OR for Right VATS, pleural biopsy tomorrow AM  [name] 01/07/2019, 1:42 PM

## 2019-01-07 NOTE — Anesthesia Preprocedure Evaluation (Addendum)
Anesthesia Evaluation  Patient identified by MRN, date of birth, ID band Patient awake    Reviewed: Allergy & Precautions, NPO status , Patient's Chart, lab work & pertinent test results  Airway Mallampati: II  TM Distance: >3 FB     Dental  (+) Teeth Intact, Dental Advisory Given   Pulmonary Current Smoker,    breath sounds clear to auscultation       Cardiovascular negative cardio ROS   Rhythm:Regular Rate:Normal     Neuro/Psych    GI/Hepatic Neg liver ROS, GERD  ,  Endo/Other  negative endocrine ROS  Renal/GU negative Renal ROS     Musculoskeletal   Abdominal   Peds  Hematology   Anesthesia Other Findings   Reproductive/Obstetrics                           Anesthesia Physical Anesthesia Plan  ASA: III  Anesthesia Plan: General   Post-op Pain Management:    Induction: Intravenous  PONV Risk Score and Plan: 1  Airway Management Planned: Double Lumen EBT  Additional Equipment:   Intra-op Plan:   Post-operative Plan:   Informed Consent: I have reviewed the patients History and Physical, chart, labs and discussed the procedure including the risks, benefits and alternatives for the proposed anesthesia with the patient or authorized representative who has indicated his/her understanding and acceptance.     Dental advisory given  Plan Discussed with: Anesthesiologist and CRNA  Anesthesia Plan Comments:        Anesthesia Quick Evaluation

## 2019-01-07 NOTE — Consult Note (Signed)
[place] CONSULT NOTE  Patient Care Team: Patient, No Pcp Per as PCP - General (General Practice)  HEME/ONC OVERVIEW: 1. 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  The pleural fluid cytology was positive for malignant cells, favoring NSCLC, but further characterization was limited due to insufficient tissue.  In the ER, CT chest showed multiple bilateral small pulmonary nodules, a small R hilar LN and small amount of the loculated right pleural effusion.  -I discussed the findings in detail with the patient as well as the family medicine team -Due to the insufficient tissue from the right pleural effusion, we are unable to further characterize the type of lung cancer (adenocarcinoma vs. squamous cell), as well as to send additional studies, including PD-L1 and molecular studies -Therefore, I would recommend obtaining further tissue, such as VATS by thoracic surgery, in order to determine his treatment options -Furthermore, I would recommend completing his staging and obtain CT abdomen/pelvis with contrast and MRI brain with contrast -Pending the studies above, we will discuss his treatment options and prognosis  Goals of care  discussion -In light of the malignant right pleural effusion, this is consistent with Stage IV lung cancer, and the goal of treatment is for palliative intent only, NOT curative -Patient expressed understanding and would like to move forward with treatment once additional information is obtained   All questions were answered. The patient knows to call the clinic with any problems, questions or concerns.  Tish Men, MD 01/07/2019 9:40 AM   CHIEF COMPLAINTS/PURPOSE OF CONSULTATION:  "I am still coughing"  HISTORY OF PRESENTING ILLNESS:  Andrew Davenport 63 y.o. male is here because of acute on chronic hypoxic respiratory failure and right-sided chest pain.  Oncology was consulted for evaluation of newly diagnosed NSCLC.  Patient first presented to [facility] in mid-12/2018 for evaluation of worsening dyspnea, and imaging studies showed large right pleural effusion, for which he underwent several thoracentesis and ultimately Pleurx placement.  While awaiting the pathology results, patient developed worsening shortness of breath yesterday, prompting him to come to [facility] ER for further evaluation.  In the ER, patient received the diagnosis from [facility] that the pleural fluid cytology was positive for malignancy, favoring NSCLC, but further characterization was limited.  Patient reports that he has lost 30-40 lbs over the past month, as well having cough and exertional dyspnea. Since the PleurX was placed, he has been draining it daily (~486m each time). He used to smoke but quit a month ago. He works as a cProduction designer, theatre/television/film He denies any other complaint today.   I have reviewed his chart and materials related to his cancer extensively and collaborated history with the patient. Summary of oncologic history is as follows: Oncology History   No history exists.    MEDICAL HISTORY:  Past Medical History:  Diagnosis Date  .  GERD (gastroesophageal reflux disease)     SURGICAL HISTORY: No  past surgical history on file.  SOCIAL HISTORY: Social History   Socioeconomic History  . Marital status: Single    Spouse name: Not on file  . Number of children: Not on file  . Years of education: Not on file  . Highest education level: Not on file  Occupational History  . Not on file  Social Needs  . Financial resource strain: Patient refused  . Food insecurity    Worry: Patient refused    Inability: Patient refused  . Transportation needs    Medical: Patient refused    Non-medical: Patient refused  Tobacco Use  . Smoking status: Current Every Day Smoker    Packs/day: 0.25    Years: 43.00    Pack years: 10.75  . Smokeless tobacco: Never Used  Substance and Sexual Activity  . Alcohol use: No    Alcohol/week: 0.0 standard drinks  . Drug use: No  . Sexual activity: Not Currently  Lifestyle  . Physical activity    Days per week: Patient refused    Minutes per session: Patient refused  . Stress: Patient refused  Relationships  . Social Herbalist on phone: Patient refused    Gets together: Patient refused    Attends religious service: Patient refused    Active member of club or organization: Patient refused    Attends meetings of clubs or organizations: Patient refused    Relationship status: Patient refused  . Intimate partner violence    Fear of current or ex partner: Patient refused    Emotionally abused: Patient refused    Physically abused: Patient refused    Forced sexual activity: Patient refused  Other Topics Concern  . Not on file  Social History Narrative  . Not on file    FAMILY HISTORY: History reviewed. No pertinent family history.  ALLERGIES:  has No Known Allergies.  MEDICATIONS:  Current Facility-Administered Medications  Medication Dose Route Frequency Provider Last Rate Last Dose  . acetaminophen (TYLENOL) tablet 650 mg  650 mg Oral Q6H PRN Shirley, [place], DO       Or  . acetaminophen (TYLENOL) suppository 650 mg  650 mg  Rectal Q6H PRN Shirley, [place], DO      . enoxaparin (LOVENOX) injection 40 mg  40 mg Subcutaneous Q24H Enid Derry, [place], DO   40 mg at 01/06/19 2238  . feeding supplement (ENSURE ENLIVE) (ENSURE ENLIVE) liquid 237 mL  237 mL Oral BID BM [name], MD   237 mL at 01/07/19 0858  . HYDROcodone-acetaminophen (NORCO/VICODIN) 5-325 MG per tablet 1 tablet  1 tablet Oral Q6H PRN Shirley, [place], DO   1 tablet at 01/07/19 0644  . ibuprofen (ADVIL) tablet 600 mg  600 mg Oral Q6H PRN Shirley, [place], DO   600 mg at 01/07/19 0900  . insulin aspart (novoLOG) injection 0-9 Units  0-9 Units Subcutaneous TID WC Enid Derry, [place], DO      . lidocaine (LIDODERM) 5 % 1 patch  1 patch Transdermal Q24H Gifford Shave, MD   1 patch at 01/06/19 2239  . ondansetron (ZOFRAN) tablet 4 mg  4 mg Oral Q6H PRN Shirley, [place], DO       Or  . ondansetron [facility]) injection 4 mg  4 mg Intravenous Q6H PRN Shirley, [place], DO      . pantoprazole (PROTONIX) EC tablet 40 mg  40 mg Oral Daily Shirley, [place], DO   40 mg at  01/07/19 0858    REVIEW OF SYSTEMS:   Constitutional: ( - ) fevers, ( - )  chills , ( - ) night sweats Eyes: ( - ) blurriness of vision, ( - ) double vision, ( - ) watery eyes Ears, nose, mouth, throat, and face: ( - ) mucositis, ( - ) sore throat Respiratory: ( + ) cough, ( + ) dyspnea, ( - ) wheezes Cardiovascular: ( - ) palpitation, ( - ) chest discomfort, ( - ) lower extremity swelling Gastrointestinal:  ( + ) nausea, ( - ) heartburn, ( - ) change in bowel habits Skin: ( - ) abnormal skin rashes Lymphatics: ( - ) new lymphadenopathy, ( - ) easy bruising Neurological: ( - ) numbness, ( - ) tingling, ( - ) new weaknesses Behavioral/Psych: ( - ) mood change, ( - ) new changes  All other systems were reviewed with the patient and are negative.  PHYSICAL EXAMINATION: ECOG PERFORMANCE STATUS: 1 - Symptomatic but completely ambulatory  Vitals:   01/06/19 2241 01/07/19 0752  BP: 116/73 124/88   Pulse: (!) 101 98  Resp: 16 17  Temp: (!) 97.5 F (36.4 C) 97.9 F (36.6 C)  SpO2: 100% 100%   Filed Weights   01/06/19 0833  Weight: 244 lb (110.7 kg)    GENERAL: alert, no distress and comfortable SKIN: skin color, texture, turgor are normal, no rashes or significant lesions EYES: conjunctiva are pink and non-injected, sclera clear OROPHARYNX: no exudate, no erythema; lips, buccal mucosa, and tongue normal  NECK: supple, non-tender LYMPH:  no palpable lymphadenopathy in the cervical LUNGS: clear to auscultation with normal breathing effort HEART: regular rhythm, mild tachycardia, no murmurs, no lower extremity edema ABDOMEN: soft, non-tender, non-distended, normal bowel sounds Musculoskeletal: no cyanosis of digits and no clubbing  PSYCH: alert & oriented x 3, fluent speech NEURO: no focal motor/sensory deficits  LABORATORY DATA:  I have reviewed the data as listed Lab Results  Component Value Date   WBC 11.5 (H) 01/07/2019   HGB 12.6 (L) 01/07/2019   HCT 38.3 (L) 01/07/2019   MCV 83.8 01/07/2019   PLT 441 (H) 01/07/2019   Lab Results  Component Value Date   NA 130 (L) 01/07/2019   K 3.7 01/07/2019   CL 90 (L) 01/07/2019   CO2 24 01/07/2019    RADIOGRAPHIC STUDIES: I have personally reviewed the radiological images as listed and agreed with the findings in the report. Dg Chest 2 View  Result Date: 01/06/2019 CLINICAL DATA:  Shortness of breath. Chest pain. Pleural effusion. Chest tube. EXAM: CHEST - 2 VIEW COMPARISON:  No recent prior. FINDINGS: Heart size normal. Right base atelectasis/infiltrate and right-sided pleural effusion. Right chest tube noted with tip over the right lower chest. No pneumothorax. Mild patchy infiltrates in the left lung cannot be excluded. Degenerative change thoracic spine. IMPRESSION: 1. Right base atelectasis/infiltrate right-sided pleural effusion. Right chest tube noted with tip over the right lower chest. No pneumothorax. 2. Mild  patchy infiltrates in left lung cannot be excluded. Electronically Signed   By: Marcello Moores  Register   On: 01/06/2019 09:27   Ct Chest W Contrast  Result Date: 01/06/2019 CLINICAL DATA:  Chest pain, shortness of breath, back pain and recent history of right pleural effusion with tunneled PleurX catheter placement at outside institution. Chest x-ray demonstrates component of right-sided pleural fluid and pulmonary airspace opacity. EXAM: CT CHEST WITH CONTRAST TECHNIQUE: Multidetector CT imaging of the chest was performed during intravenous contrast administration. CONTRAST:  78m OMNIPAQUE IOHEXOL 300 MG/ML  SOLN COMPARISON:  Chest x-ray earlier today. FINDINGS: Cardiovascular: The ascending thoracic aorta is mildly dilated and measures 4 cm in maximal measured diameter. The aortic root is normal in caliber and measures 3.4-3.6 cm. The proximal arch measures 3.8 cm and the distal arch 2.8 cm. The descending thoracic aorta measures 2.6 cm. No evidence of aortic dissection. Proximal great vessels are normally patent and demonstrate bovine branching anatomy. The pulmonary arteries are also well opacified and demonstrates normal caliber without visible pulmonary embolism. Mediastinum/Nodes: No evidence of mediastinal lymphadenopathy or mass. Mildly prominent right hilar lymph node measures approximately 1.5 cm. Unremarkable thyroid gland, of CT appearance of the esophagus and trachea. Lungs/Pleura: Tunneled pleural drainage catheter enters the lateral pleural space and ascends posteriorly. There is a small amount of loculated pleural fluid present in the pleural space with associated pleural thickening. Parenchymal atelectasis and scarring present with lack of complete expansion of the right lung. There are multiple bilateral ill-defined pulmonary nodules in both upper and lower lobes. These are scattered predominantly in a peripheral distribution and are all approximately 7 mm or less in size. These are not smoothly  demarcated nodules and may be inflammatory in nature. However, metastatic disease cannot be excluded and correlation suggested with any known diagnosis of malignancy. No pneumothorax, pulmonary edema or significant focal airspace consolidation is identified. Upper Abdomen: Focal hypervascular focus in a relatively arterial phase of imaging in the liver in segment III measures approximately 1.7 cm in greatest diameter. Differential considerations include hemangioma, perfusion anomaly or hypervascular mass. No other focal abnormalities are identified in the visualized upper to mid liver. Musculoskeletal: No fractures or focal bony lesions identified. IMPRESSION: 1. Mildly dilated ascending thoracic aorta measures 4 cm in greatest diameter. Recommend annual imaging followup by CTA or MRA. This recommendation follows 2010 ACCF/AHA/AATS/ACR/ASA/SCA/SCAI/SIR/STS/SVM Guidelines for the Diagnosis and Management of Patients with Thoracic Aortic Disease. Circulation. 2010; 121:: M580-I634 Aortic aneurysm NOS (ICD10-I71.9) 2. Mildly enlarged right hilar lymph node measures 1.5 cm in short axis. 3. Small amount of loculated right pleural fluid with associated pleural thickening and restricted expansion of the right lung. Indwelling PleurX catheter present. 4. Multiple ill-defined bilateral small pulmonary nodules all measuring under 1 cm. These may be inflammatory in nature based on appearance. However, metastatic disease is not excluded and correlation suggested with any known diagnosis of malignancy. 5. 1.7 cm hypervascular lesion in segment III of the liver with differential including hemangioma, perfusion anomaly or hypervascular mass. Correlation suggested with any prior abdominal CT or MRI imaging. Aortic aneurysm NOS (ICD10-I71.9). Electronically Signed   By: GAletta EdouardM.D.   On: 01/06/2019 14:19    PATHOLOGY: I have reviewed the pathology reports as documented in the oncologist history.

## 2019-01-08 ENCOUNTER — Encounter (HOSPITAL_COMMUNITY): Payer: Self-pay | Admitting: Certified Registered"

## 2019-01-08 ENCOUNTER — Inpatient Hospital Stay (HOSPITAL_COMMUNITY): Payer: No Typology Code available for payment source | Admitting: Certified Registered Nurse Anesthetist

## 2019-01-08 ENCOUNTER — Encounter (HOSPITAL_COMMUNITY)
Admission: EM | Disposition: A | Discharge status: Home Health | Payer: Self-pay | Source: Home / Self Care | Attending: Family Medicine

## 2019-01-08 ENCOUNTER — Inpatient Hospital Stay (HOSPITAL_COMMUNITY): Payer: No Typology Code available for payment source

## 2019-01-08 DIAGNOSIS — J910 Malignant pleural effusion: Secondary | ICD-10-CM

## 2019-01-08 HISTORY — PX: PLEURAL BIOPSY: SHX5082

## 2019-01-08 HISTORY — PX: VIDEO ASSISTED THORACOSCOPY: SHX5073

## 2019-01-08 LAB — GLUCOSE, CAPILLARY
Glucose-Capillary: 115 mg/dL — ABNORMAL HIGH (ref 70–99)
Glucose-Capillary: 156 mg/dL — ABNORMAL HIGH (ref 70–99)
Glucose-Capillary: 218 mg/dL — ABNORMAL HIGH (ref 70–99)
Glucose-Capillary: 212 mg/dL — ABNORMAL HIGH (ref 70–99)

## 2019-01-08 LAB — ABO/RH: ABO/RH(D): B POS

## 2019-01-08 SURGERY — VIDEO ASSISTED THORACOSCOPY
Anesthesia: General | Site: Chest | Laterality: Right

## 2019-01-08 MED ORDER — PHENYLEPHRINE 40 MCG/ML (10ML) SYRINGE FOR IV PUSH (FOR BLOOD PRESSURE SUPPORT)
PREFILLED_SYRINGE | INTRAVENOUS | Status: AC
  Filled 2019-01-08: qty 10

## 2019-01-08 MED ORDER — MIDAZOLAM HCL 5 MG/5ML IJ SOLN
INTRAMUSCULAR | Status: DC | PRN
  Administered 2019-01-08: 0.5 mg via INTRAVENOUS
  Administered 2019-01-08: 1.5 mg via INTRAVENOUS

## 2019-01-08 MED ORDER — TRAMADOL HCL 50 MG PO TABS
50.0000 mg | ORAL_TABLET | Freq: Four times a day (QID) | ORAL | Status: DC | PRN
  Administered 2019-01-11: 100 mg via ORAL
  Filled 2019-01-08: qty 2

## 2019-01-08 MED ORDER — 0.9 % SODIUM CHLORIDE (POUR BTL) OPTIME
TOPICAL | Status: DC | PRN
  Administered 2019-01-08: 3000 mL

## 2019-01-08 MED ORDER — DIPHENHYDRAMINE HCL 50 MG/ML IJ SOLN: 12.5000 mg | Freq: Four times a day (QID) | INTRAMUSCULAR | Status: DC | PRN

## 2019-01-08 MED ORDER — ALBUMIN HUMAN 5 % IV SOLN
INTRAVENOUS | Status: DC | PRN
  Administered 2019-01-08: 09:00:00 via INTRAVENOUS

## 2019-01-08 MED ORDER — LACTATED RINGERS IV SOLN
INTRAVENOUS | Status: DC | PRN
  Administered 2019-01-08: 07:00:00 via INTRAVENOUS

## 2019-01-08 MED ORDER — FENTANYL CITRATE (PF) 100 MCG/2ML IJ SOLN
INTRAMUSCULAR | Status: DC | PRN
  Administered 2019-01-08: 25 ug via INTRAVENOUS
  Administered 2019-01-08: 100 ug via INTRAVENOUS
  Administered 2019-01-08: 75 ug via INTRAVENOUS
  Administered 2019-01-08 (×2): 50 ug via INTRAVENOUS

## 2019-01-08 MED ORDER — LIDOCAINE 2% (20 MG/ML) 5 ML SYRINGE
INTRAMUSCULAR | Status: AC
  Filled 2019-01-08: qty 5

## 2019-01-08 MED ORDER — GLYCOPYRROLATE PF 0.2 MG/ML IJ SOSY
PREFILLED_SYRINGE | INTRAMUSCULAR | Status: AC
  Filled 2019-01-08: qty 1

## 2019-01-08 MED ORDER — BISACODYL 5 MG PO TBEC
10.0000 mg | DELAYED_RELEASE_TABLET | Freq: Every day | ORAL | Status: DC
  Administered 2019-01-09 – 2019-01-13 (×4): 10 mg via ORAL
  Filled 2019-01-08 (×5): qty 2

## 2019-01-08 MED ORDER — ROCURONIUM BROMIDE 50 MG/5ML IV SOSY
PREFILLED_SYRINGE | INTRAVENOUS | Status: DC | PRN
  Administered 2019-01-08: 20 mg via INTRAVENOUS
  Administered 2019-01-08: 70 mg via INTRAVENOUS

## 2019-01-08 MED ORDER — ONDANSETRON HCL 4 MG/2ML IJ SOLN
INTRAMUSCULAR | Status: DC | PRN
  Administered 2019-01-08: 4 mg via INTRAVENOUS

## 2019-01-08 MED ORDER — FENTANYL CITRATE (PF) 250 MCG/5ML IJ SOLN
INTRAMUSCULAR | Status: AC
  Filled 2019-01-08: qty 5

## 2019-01-08 MED ORDER — CEFAZOLIN SODIUM-DEXTROSE 2-4 GM/100ML-% IV SOLN
2.0000 g | Freq: Three times a day (TID) | INTRAVENOUS | Status: AC
  Administered 2019-01-08 (×2): 2 g via INTRAVENOUS
  Filled 2019-01-08 (×2): qty 100

## 2019-01-08 MED ORDER — EPHEDRINE SULFATE-NACL 50-0.9 MG/10ML-% IV SOSY
PREFILLED_SYRINGE | INTRAVENOUS | Status: DC | PRN
  Administered 2019-01-08: 15 mg via INTRAVENOUS

## 2019-01-08 MED ORDER — MIDAZOLAM HCL 2 MG/2ML IJ SOLN
INTRAMUSCULAR | Status: AC
  Filled 2019-01-08: qty 2

## 2019-01-08 MED ORDER — SUGAMMADEX SODIUM 200 MG/2ML IV SOLN
INTRAVENOUS | Status: DC | PRN
  Administered 2019-01-08: 300 mg via INTRAVENOUS

## 2019-01-08 MED ORDER — PROPOFOL 10 MG/ML IV BOLUS
INTRAVENOUS | Status: AC
  Filled 2019-01-08: qty 20

## 2019-01-08 MED ORDER — SODIUM CHLORIDE 0.9% FLUSH: 9.0000 mL | INTRAVENOUS | Status: DC | PRN

## 2019-01-08 MED ORDER — ONDANSETRON HCL 4 MG/2ML IJ SOLN
INTRAMUSCULAR | Status: AC
  Filled 2019-01-08: qty 2

## 2019-01-08 MED ORDER — SENNOSIDES-DOCUSATE SODIUM 8.6-50 MG PO TABS
1.0000 | ORAL_TABLET | Freq: Every day | ORAL | Status: DC
  Administered 2019-01-08 – 2019-01-10 (×2): 1 via ORAL
  Filled 2019-01-08 (×4): qty 1

## 2019-01-08 MED ORDER — ONDANSETRON HCL 4 MG/2ML IJ SOLN: 4.0000 mg | Freq: Four times a day (QID) | INTRAMUSCULAR | Status: DC | PRN

## 2019-01-08 MED ORDER — ROCURONIUM BROMIDE 10 MG/ML (PF) SYRINGE
PREFILLED_SYRINGE | INTRAVENOUS | Status: AC
  Filled 2019-01-08: qty 10

## 2019-01-08 MED ORDER — ACETAMINOPHEN 500 MG PO TABS
1000.0000 mg | ORAL_TABLET | Freq: Four times a day (QID) | ORAL | Status: DC
  Administered 2019-01-08: 1000 mg via ORAL
  Filled 2019-01-08 (×2): qty 2

## 2019-01-08 MED ORDER — LIDOCAINE 2% (20 MG/ML) 5 ML SYRINGE
INTRAMUSCULAR | Status: DC | PRN
  Administered 2019-01-08: 100 mg via INTRAVENOUS

## 2019-01-08 MED ORDER — ACETAMINOPHEN 160 MG/5ML PO SOLN: 1000.0000 mg | Freq: Four times a day (QID) | ORAL | Status: DC

## 2019-01-08 MED ORDER — NALOXONE HCL 0.4 MG/ML IJ SOLN: 0.4000 mg | INTRAMUSCULAR | Status: DC | PRN

## 2019-01-08 MED ORDER — BOOST / RESOURCE BREEZE PO LIQD CUSTOM
1.0000 | Freq: Three times a day (TID) | ORAL | Status: DC
  Administered 2019-01-08 – 2019-01-10 (×5): 1 via ORAL

## 2019-01-08 MED ORDER — BUPIVACAINE LIPOSOME 1.3 % IJ SUSP
20.0000 mL | INTRAMUSCULAR | Status: DC
  Filled 2019-01-08: qty 20

## 2019-01-08 MED ORDER — SODIUM CHLORIDE 0.9 % IV SOLN
INTRAVENOUS | Status: DC
  Administered 2019-01-08 (×2): via INTRAVENOUS
  Administered 2019-01-09: 1000 mL via INTRAVENOUS
  Administered 2019-01-09 – 2019-01-10 (×2): via INTRAVENOUS

## 2019-01-08 MED ORDER — HYDROMORPHONE HCL 1 MG/ML IJ SOLN: 0.2500 mg | INTRAMUSCULAR | Status: DC | PRN

## 2019-01-08 MED ORDER — MORPHINE SULFATE 2 MG/ML IV SOLN
INTRAVENOUS | Status: DC
  Administered 2019-01-08: 10:00:00 via INTRAVENOUS
  Administered 2019-01-08 (×2): 12 mg via INTRAVENOUS
  Administered 2019-01-09: 7.5 mg via INTRAVENOUS
  Administered 2019-01-09: 06:00:00 via INTRAVENOUS
  Administered 2019-01-09: 12 mg via INTRAVENOUS
  Filled 2019-01-08 (×3): qty 30

## 2019-01-08 MED ORDER — SUCCINYLCHOLINE CHLORIDE 200 MG/10ML IV SOSY
PREFILLED_SYRINGE | INTRAVENOUS | Status: AC
  Filled 2019-01-08: qty 10

## 2019-01-08 MED ORDER — METOCLOPRAMIDE HCL 5 MG/ML IJ SOLN
10.0000 mg | Freq: Four times a day (QID) | INTRAMUSCULAR | Status: AC
  Administered 2019-01-08 – 2019-01-09 (×4): 10 mg via INTRAVENOUS
  Filled 2019-01-08 (×4): qty 2

## 2019-01-08 MED ORDER — GLYCOPYRROLATE 0.2 MG/ML IJ SOLN
INTRAMUSCULAR | Status: DC | PRN
  Administered 2019-01-08: 0.1 mg via INTRAVENOUS

## 2019-01-08 MED ORDER — DIPHENHYDRAMINE HCL 12.5 MG/5ML PO ELIX
12.5000 mg | ORAL_SOLUTION | Freq: Four times a day (QID) | ORAL | Status: DC | PRN
  Filled 2019-01-08: qty 5

## 2019-01-08 MED ORDER — ENOXAPARIN SODIUM 40 MG/0.4ML ~~LOC~~ SOLN
40.0000 mg | SUBCUTANEOUS | Status: DC
  Administered 2019-01-08 – 2019-01-11 (×4): 40 mg via SUBCUTANEOUS
  Filled 2019-01-08 (×4): qty 0.4

## 2019-01-08 MED ORDER — DEXAMETHASONE SODIUM PHOSPHATE 10 MG/ML IJ SOLN
INTRAMUSCULAR | Status: AC
  Filled 2019-01-08: qty 1

## 2019-01-08 MED ORDER — EPHEDRINE 5 MG/ML INJ
INTRAVENOUS | Status: AC
  Filled 2019-01-08: qty 10

## 2019-01-08 MED ORDER — CEFAZOLIN SODIUM-DEXTROSE 2-4 GM/100ML-% IV SOLN
INTRAVENOUS | Status: AC
  Filled 2019-01-08: qty 100

## 2019-01-08 MED ORDER — DEXAMETHASONE SODIUM PHOSPHATE 10 MG/ML IJ SOLN
INTRAMUSCULAR | Status: DC | PRN
  Administered 2019-01-08: 10 mg via INTRAVENOUS

## 2019-01-08 MED ORDER — PROPOFOL 10 MG/ML IV BOLUS
INTRAVENOUS | Status: DC | PRN
  Administered 2019-01-08: 40 mg via INTRAVENOUS
  Administered 2019-01-08: 160 mg via INTRAVENOUS

## 2019-01-08 MED ORDER — ALUM & MAG HYDROXIDE-SIMETH 200-200-20 MG/5ML PO SUSP: 30.0000 mL | Freq: Four times a day (QID) | ORAL | Status: DC | PRN

## 2019-01-08 MED ORDER — PHENYLEPHRINE 40 MCG/ML (10ML) SYRINGE FOR IV PUSH (FOR BLOOD PRESSURE SUPPORT)
PREFILLED_SYRINGE | INTRAVENOUS | Status: DC | PRN
  Administered 2019-01-08: 200 ug via INTRAVENOUS
  Administered 2019-01-08: 80 ug via INTRAVENOUS

## 2019-01-08 SURGICAL SUPPLY — 87 items
ADH SKN CLS APL DERMABOND .7 (GAUZE/BANDAGES/DRESSINGS) ×2
APL SWBSTK 6 STRL LF DISP (MISCELLANEOUS)
APPLICATOR COTTON TIP 6 STRL (MISCELLANEOUS) IMPLANT
APPLICATOR COTTON TIP 6IN STRL (MISCELLANEOUS) IMPLANT
APPLIER CLIP ROT 10 11.4 M/L (STAPLE)
APR CLP MED LRG 11.4X10 (STAPLE)
BAG SPEC RTRVL LRG 6X4 10 (ENDOMECHANICALS)
BLADE CLIPPER SURG (BLADE) ×4 IMPLANT
CANISTER SUCT 3000ML PPV (MISCELLANEOUS) ×4 IMPLANT
CATH THORACIC 28FR RT ANG (CATHETERS) IMPLANT
CATH THORACIC 36FR (CATHETERS) IMPLANT
CATH THORACIC 36FR RT ANG (CATHETERS) IMPLANT
CLIP APPLIE ROT 10 11.4 M/L (STAPLE) IMPLANT
CLIP VESOCCLUDE MED 6/CT (CLIP) IMPLANT
CONN Y 3/8X3/8X3/8  BEN (MISCELLANEOUS)
CONN Y 3/8X3/8X3/8 BEN (MISCELLANEOUS) ×2 IMPLANT
CONT SPEC 4OZ CLIKSEAL STRL BL (MISCELLANEOUS) ×12 IMPLANT
COVER SURGICAL LIGHT HANDLE (MISCELLANEOUS) ×4 IMPLANT
COVER WAND RF STERILE (DRAPES) ×2 IMPLANT
DERMABOND ADVANCED (GAUZE/BANDAGES/DRESSINGS) ×2
DERMABOND ADVANCED .7 DNX12 (GAUZE/BANDAGES/DRESSINGS) IMPLANT
DRAIN CHANNEL 28F RND 3/8 FF (WOUND CARE) IMPLANT
DRAIN CHANNEL 32F RND 10.7 FF (WOUND CARE) IMPLANT
DRAPE INCISE IOBAN 66X45 STRL (DRAPES) ×2 IMPLANT
DRAPE LAPAROSCOPIC ABDOMINAL (DRAPES) ×4 IMPLANT
DRAPE ORTHO SPLIT 77X108 STRL (DRAPES) ×4
DRAPE SLUSH/WARMER DISC (DRAPES) ×4 IMPLANT
DRAPE SURG ORHT 6 SPLT 77X108 (DRAPES) IMPLANT
DRSG TEGADERM 4X4.75 (GAUZE/BANDAGES/DRESSINGS) ×2 IMPLANT
ELECT REM PT RETURN 9FT ADLT (ELECTROSURGICAL) ×4
ELECTRODE REM PT RTRN 9FT ADLT (ELECTROSURGICAL) ×2 IMPLANT
GAUZE SPONGE 4X4 12PLY STRL (GAUZE/BANDAGES/DRESSINGS) ×4 IMPLANT
GAUZE SPONGE 4X4 12PLY STRL LF (GAUZE/BANDAGES/DRESSINGS) ×2 IMPLANT
GLOVE BIO SURGEON STRL SZ 6.5 (GLOVE) ×3 IMPLANT
GLOVE BIO SURGEONS STRL SZ 6.5 (GLOVE) ×3
GLOVE SURG SIGNA 7.5 PF LTX (GLOVE) ×8 IMPLANT
GOWN STRL REUS W/ TWL LRG LVL3 (GOWN DISPOSABLE) ×4 IMPLANT
GOWN STRL REUS W/ TWL XL LVL3 (GOWN DISPOSABLE) ×2 IMPLANT
GOWN STRL REUS W/TWL LRG LVL3 (GOWN DISPOSABLE) ×12
GOWN STRL REUS W/TWL XL LVL3 (GOWN DISPOSABLE) ×4
HEMOSTAT SURGICEL 2X14 (HEMOSTASIS) IMPLANT
IV CATH 22GX1 FEP (IV SOLUTION) IMPLANT
KIT BASIN OR (CUSTOM PROCEDURE TRAY) ×4 IMPLANT
KIT PLEURX DRAIN CATH 1000ML (MISCELLANEOUS) ×2 IMPLANT
KIT SUCTION CATH 14FR (SUCTIONS) ×4 IMPLANT
KIT TURNOVER KIT B (KITS) ×4 IMPLANT
NDL SPNL 18GX3.5 QUINCKE PK (NEEDLE) ×2 IMPLANT
NEEDLE SPNL 18GX3.5 QUINCKE PK (NEEDLE) IMPLANT
NS IRRIG 1000ML POUR BTL (IV SOLUTION) ×10 IMPLANT
PACK CHEST (CUSTOM PROCEDURE TRAY) ×4 IMPLANT
PAD ARMBOARD 7.5X6 YLW CONV (MISCELLANEOUS) ×8 IMPLANT
POUCH ENDO CATCH II 15MM (MISCELLANEOUS) IMPLANT
POUCH SPECIMEN RETRIEVAL 10MM (ENDOMECHANICALS) IMPLANT
SEALANT PROGEL (MISCELLANEOUS) IMPLANT
SEALANT SURG COSEAL 4ML (VASCULAR PRODUCTS) IMPLANT
SEALANT SURG COSEAL 8ML (VASCULAR PRODUCTS) IMPLANT
SLEEVE ENDOPATH XCEL 5M (ENDOMECHANICALS) ×2 IMPLANT
SOLUTION ANTI FOG 6CC (MISCELLANEOUS) ×6 IMPLANT
SPECIMEN JAR MEDIUM (MISCELLANEOUS) ×2 IMPLANT
SPONGE INTESTINAL PEANUT (DISPOSABLE) ×2 IMPLANT
SPONGE TONSIL TAPE 1 RFD (DISPOSABLE) ×4 IMPLANT
SUT PROLENE 4 0 RB 1 (SUTURE)
SUT PROLENE 4-0 RB1 .5 CRCL 36 (SUTURE) IMPLANT
SUT SILK  1 MH (SUTURE) ×2
SUT SILK 1 MH (SUTURE) ×4 IMPLANT
SUT SILK 1 TIES 10X30 (SUTURE) ×2 IMPLANT
SUT SILK 2 0 TIES 10X30 (SUTURE) ×2 IMPLANT
SUT SILK 2 0SH CR/8 30 (SUTURE) IMPLANT
SUT SILK 3 0SH CR/8 30 (SUTURE) IMPLANT
SUT VIC AB 1 CTX 36 (SUTURE) ×4
SUT VIC AB 1 CTX36XBRD ANBCTR (SUTURE) IMPLANT
SUT VIC AB 2-0 CTX 36 (SUTURE) ×2 IMPLANT
SUT VIC AB 2-0 UR6 27 (SUTURE) ×2 IMPLANT
SUT VIC AB 3-0 MH 27 (SUTURE) IMPLANT
SUT VIC AB 3-0 X1 27 (SUTURE) ×6 IMPLANT
SUT VICRYL 2 TP 1 (SUTURE) IMPLANT
SYR 20ML LL LF (SYRINGE) IMPLANT
SYSTEM SAHARA CHEST DRAIN ATS (WOUND CARE) ×4 IMPLANT
TAPE CLOTH 4X10 WHT NS (GAUZE/BANDAGES/DRESSINGS) ×4 IMPLANT
TAPE CLOTH SURG 4X10 WHT LF (GAUZE/BANDAGES/DRESSINGS) ×2 IMPLANT
TIP APPLICATOR SPRAY EXTEND 16 (VASCULAR PRODUCTS) IMPLANT
TOWEL GREEN STERILE (TOWEL DISPOSABLE) ×4 IMPLANT
TOWEL GREEN STERILE FF (TOWEL DISPOSABLE) ×4 IMPLANT
TRAY FOLEY MTR SLVR 16FR STAT (SET/KITS/TRAYS/PACK) ×4 IMPLANT
TROCAR XCEL BLADELESS 5X75MML (TROCAR) ×4 IMPLANT
TROCAR XCEL NON-BLD 5MMX100MML (ENDOMECHANICALS) IMPLANT
WATER STERILE IRR 1000ML POUR (IV SOLUTION) ×6 IMPLANT

## 2019-01-08 NOTE — Progress Notes (Signed)
EVENING ROUNDS NOTE :     [address]       [place] [address]             [phone]                 Day of Surgery Procedure(s) (LRB): VIDEO ASSISTED THORACOSCOPY (Right) PLEURAL BIOPSY (Right)   Total Length of Stay:  LOS: 2 days  Events:  No events    BP 115/79 (BP Location: Right Arm)   Pulse 87   Temp 97.9 F (36.6 C) (Oral)   Resp 14   Ht 6\' 1"  (1.854 m)   Wt 110.7 kg   SpO2 99%   BMI 32.19 kg/m         . sodium chloride 100 mL/hr at 01/08/19 1700  . ceFAZolin    .  ceFAZolin (ANCEF) IV Stopped (01/08/19 1647)    I/O last 3 completed shifts: In: 480 [P.O.:480] Out: 675 [Urine:150; Drains:525]   CBC Latest Ref Rng & Units 01/07/2019 01/07/2019 01/06/2019  WBC 4.0 - 10.5 K/uL 11.8(H) 11.5(H) 10.3  Hemoglobin 13.0 - 17.0 g/dL 12.0(L) 12.6(L) 14.4  Hematocrit 39.0 - 52.0 % 35.9(L) 38.3(L) 44.3  Platelets 150 - 400 K/uL 433(H) 441(H) 392    BMP Latest Ref Rng & Units 01/07/2019 01/07/2019 01/06/2019  Glucose 70 - 99 mg/dL 163(H) 116(H) 131(H)  BUN 6 - 20 mg/dL 14 9 8   Creatinine 0.61 - 1.24 mg/dL 1.03 0.92 0.91  Sodium 135 - 145 mmol/L 129(L) 130(L) 131(L)  Potassium 3.5 - 5.1 mmol/L 3.9 3.7 3.7  Chloride 98 - 111 mmol/L 89(L) 90(L) 89(L)  CO2 22 - 32 mmol/L 27 24 27   Calcium 8.9 - 10.3 mg/dL 9.0 9.2 9.4    ABG    Component Value Date/Time   PHART 7.460 (H) 01/07/2019 1750   PCO2ART 41.0 01/07/2019 1750   PO2ART 101 01/07/2019 1750   HCO3 28.8 (H) 01/07/2019 1750   O2SAT 97.7 01/07/2019 [place], MD 01/08/2019 6:01 PM

## 2019-01-08 NOTE — Op Note (Signed)
NAME: Andrew Davenport, Andrew Davenport MEDICAL RECORD ZH:[id number] ACCOUNT [id number] DATE OF BIRTH:04/05/1956 FACILITY: [facility] LOCATION: [facility] PHYSICIAN:Declyn Offield Chaya Jan, MD  OPERATIVE REPORT  DATE OF PROCEDURE:  01/08/2019  PREOPERATIVE DIAGNOSIS:  Malignant pleural effusion, likely stage IV lung cancer.  POSTOPERATIVE DIAGNOSIS:  Malignant pleural effusion, likely stage IV lung cancer.  PROCEDURE:  Right video-assisted thoracoscopy, pleural biopsy.  SURGEON:  Modesto Charon, MD  ASSISTANT:  Ellwood Handler, PA-C  ANESTHESIA:  General.  FINDINGS:  Approximately a liter of residual pleural fluid.  Right lower lobe trapped with incomplete reexpansion. Moderate fibrinous exudate. Parietal pleural surface with sheets of tumor cells present.  Frozen section revealed adenocarcinoma.  CLINICAL NOTE:  The patient is a 63 year old man with a history of tobacco abuse who presented with shortness of breath.  He was found to have a large right pleural effusion.  He had undergone a thoracentesis at an outside institution.  Three cytologies  were performed and one showed malignant cells, but there was not sufficient tissue to definitively characterize the malignancy or to complete all testing necessary to guide appropriate therapy.  He was advised to undergo right VATS for pleural biopsy for  diagnostic purposes.  The indications, risks, benefits, and alternatives were discussed in detail with the patient.  He understood and accepted the risks and agreed to proceed.  OPERATIVE NOTE:  The patient was brought to the operating room on 01/08/2019.  He had induction of general anesthesia and was intubated with a double-lumen endotracheal tube.  Intravenous antibiotics were administered.  Sequential compression devices  were placed on the calves for DVT prophylaxis.  A Foley catheter was placed.  He was placed in a left lateral decubitus position, and the right chest was prepped and draped in the usual sterile  fashion.  Single-lung ventilation of the left lung was  initiated and was tolerated well throughout the procedure.  A timeout was performed.  An incision was made in the 8th interspace in the midaxillary line.  The chest was entered bluntly using a hemostat.  A sucker was advanced and a small amount of fluid was evacuated.  The 5 mm port was placed into the chest.   The thoracoscope was advanced into the chest.  There was extensive exudate around the pleural catheter and also some exudate in the costophrenic gutter.  Posteriorly, there was a moderate pleural effusion.  A 3 cm working incision was made in the 6th  interspace anterolaterally.  No rib spreading was performed during the procedure.  The remainder of the pleural fluid was evacuated.  The lower and middle lobes were entrapped and did not completely reexpand.  The parietal pleura was essentially  completely replaced with sheets of white nodular tissue.  Biopsies were taken from this.  There was minimal bleeding.  Frozen section revealed adenocarcinoma.  The chest was copiously irrigated with 2 L of warm saline.  Both incisions were closed in  standard fashion.  Dual-lung ventilation was resumed.  A PleurX catheter was placed to Pleur-evac drainage.  The patient was placed back in supine position.  He was extubated in the operating room and taken to the [facility] in good  condition.  All sponge, needle, and instrument counts were correct at the end of the procedure.  LN/NUANCE  D:01/08/2019 T:01/08/2019 JOB:[id number]

## 2019-01-08 NOTE — Anesthesia Procedure Notes (Addendum)
Arterial Line Insertion Start/End8/12/2018 7:00 AM, 01/08/2019 7:05 AM Performed by: Cleda Daub, CRNA, CRNA  Patient location: Pre-op. Preanesthetic checklist: patient identified, IV checked, risks and benefits discussed, surgical consent, monitors and equipment checked, pre-op evaluation and timeout performed Lidocaine 1% used for infiltration and patient sedated Left, radial was placed Catheter size: 20 G Hand hygiene performed  and maximum sterile barriers used   Attempts: 1 Procedure performed without using ultrasound guided technique. Following insertion, dressing applied and Biopatch. Post procedure assessment: normal  Patient tolerated the procedure well with no immediate complications.

## 2019-01-08 NOTE — Interval H&P Note (Signed)
History and Physical Interval Note:  01/08/2019 7:14 AM  Andrew Davenport  has presented today for surgery, with the diagnosis of LUNG CANCER.  The various methods of treatment have been discussed with the patient and family. After consideration of risks, benefits and other options for treatment, the patient has consented to  Procedure(s): VIDEO ASSISTED THORACOSCOPY (Right) PLEURAL BIOPSY (Right) as a surgical intervention.  The patient's history has been reviewed, patient examined, no change in status, stable for surgery.  I have reviewed the patient's chart and labs.  Questions were answered to the patient's satisfaction.     [name]

## 2019-01-08 NOTE — Transfer of Care (Signed)
Immediate Anesthesia Transfer of Care Note  Patient: Andrew Davenport  Procedure(s) Performed: VIDEO ASSISTED THORACOSCOPY (Right Chest) PLEURAL BIOPSY (Right )  Patient Location: PACU  Anesthesia Type:General  Level of Consciousness: awake, alert , oriented and patient cooperative  Airway & Oxygen Therapy: Patient Spontanous Breathing and Patient connected to face mask oxygen  Post-op Assessment: Report given to RN, Post -op Vital signs reviewed and stable and Patient moving all extremities X 4  Post vital signs: Reviewed and stable  Last Vitals:  Vitals Value Taken Time  BP 135/91 01/08/19 0940  Temp    Pulse 90 01/08/19 0939  Resp 20 01/08/19 0940  SpO2 96 % 01/08/19 0939  Vitals shown include unvalidated device data.  Last Pain:  Vitals:   01/07/19 2317  TempSrc: Oral  PainSc:       Patients Stated Pain Goal: 0 (71/82/09 9068)  Complications: No apparent anesthesia complications

## 2019-01-08 NOTE — Progress Notes (Signed)
Interim Progress Note   Pain control:  Patient with Morphine PCA (PCA dose 1.5mg  q 8 minutes, max 7.5mg  per hour), scheduled norco 10mg  q4hrs (last at noon), scheduled tylenol, and scheduled ibuprofen and tramadol PRN. Patient has had 8.5 mg of morphine in the last hour per RN at bedside. Patient's current pain is 6/10.   Continue PCA   discontinute norco 10 scheduled   Will ultimately transition pt from PCA to PO pain control when his pain allows  Additionally, patient will remain on FMTS team for remainder of admission. CVTS consulted for procedure only.   Wilber Oliphant, M.D.  PGY-2  Family Medicine  [phone] 01/08/2019 3:24 PM

## 2019-01-08 NOTE — Anesthesia Postprocedure Evaluation (Signed)
Anesthesia Post Note  Patient: Andrew Davenport  Procedure(s) Performed: VIDEO ASSISTED THORACOSCOPY (Right Chest) PLEURAL BIOPSY (Right )     Patient location during evaluation: PACU Anesthesia Type: General Level of consciousness: awake Pain management: pain level controlled Vital Signs Assessment: post-procedure vital signs reviewed and stable Respiratory status: spontaneous breathing Cardiovascular status: stable Postop Assessment: no apparent nausea or vomiting Anesthetic complications: no    Last Vitals:  Vitals:   01/08/19 1027 01/08/19 1100  BP: (!) 137/95 (!) 133/94  Pulse: 87   Resp: 20 12  Temp:    SpO2: 95% 92%    Last Pain:  Vitals:   01/08/19 1100  TempSrc:   PainSc: 8                  Deara Bober

## 2019-01-08 NOTE — Anesthesia Procedure Notes (Signed)
Central Venous Catheter Insertion Performed by: Belinda Block, MD, anesthesiologist Start/End8/12/2018 6:50 AM, 01/08/2019 7:10 AM Patient location: Pre-op. Preanesthetic checklist: patient identified, IV checked, site marked, risks and benefits discussed, surgical consent, monitors and equipment checked, pre-op evaluation and timeout performed Position: Trendelenburg Lidocaine 1% used for infiltration and patient sedated Hand hygiene performed , maximum sterile barriers used  and Seldinger technique used Central line was placed.Double lumen Procedure performed using ultrasound guided technique. Ultrasound Notes:anatomy identified and image(s) printed for medical record Attempts: 1 Following insertion, line sutured, dressing applied and Biopatch. Post procedure assessment: blood return through all ports  Patient tolerated the procedure well with no immediate complications.

## 2019-01-08 NOTE — Progress Notes (Addendum)
Family Medicine Teaching Service Daily Progress Note Intern Pager: [phone]  Patient name: Andrew Davenport Medical record number: [id number] Date of birth: 04-08-1956 Age: 63 y.o. Gender: male  Primary Care Provider: Patient, No Pcp Per Consultants: IR Code Status: Full  Pt Overview and Major Events to Date:  Andrew Davenport is a 63 y.o. male presenting with progressive SOB and right-sided chest pain. PMH is significant for small cell carcinoma, recurrent pleural effusions requiring thoracocentesis and type 2 diabetes.  Assessment and Plan: Hypoxemic respiratory failure 2/2 non-small cell carcinoma/malignant pleural effusions Seen pt post operatively in Heart unit following VATs and biospy. Pt's breathing has improved following procedure and with oxygen therapy  Sats 96% on 2Loxygen -CVTS-appreciate recs            -will follow recommendations            -pending biopsy results  -Oncology-appreciate recs            -MRI brain with contrast-ordered, pending             -CT abdo pelvis with contrast on 8/7-No definite findings suggest presence of metastatic disease. liver lesion in segment 3 of the liver remains indeterminate on today's examination, but is strongly favored to represent a benign flash fill cavernous hemangioma. Haziness in the root of the small bowel mesentery inferior to the pancreas. This can sometimes be seen and can be benign. Alternatively, this could be seen in the setting of mild acute pancreatitis. Correlation with lipase levels is suggested. Serum Lipase 32 on 8/7  -Awaiting palliative consult  -Continue to monitor and document Pleurx fluid amount and color -Continue to monitor vitals  Pain 2/2 to malignancy/pleural effusions Management of pain as per CVTS team  Pain regime on floor was: -Hydrocodone- acetaminophen 5-325 2 tabs Q4hrly -Ibuprofen 600 mg Q6Hrly  -Monitor pain   Type 2 diabetes mellitus, well-controlled  Recent diagnosis. HbA1c 7.4  12/21/2018 Home meds: metformin 750mg  daily. 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 Doing well postoperatively, dyspnea improved following surgery and with oxygen. Pt will contact family for update later today. No new concerns.   Objective: Temp:  [97.7 F (36.5 C)-98.3 F (36.8 C)] 97.7 F (36.5 C) (08/06 2317) Pulse Rate:  [92-99] 99 (08/06 2317) Resp:  [17-18] 17 (08/06 2317) BP: (106-124)/(78-95) 107/95 (08/06 2317) SpO2:  [98 %-100 %] 98 % (08/06 2317)  General: Alert, cooperative and appears to be in no acute distress, 2L oxygen nasal cannula HEENT: Neck non-tender without lymphadenopathy, masses or thyromegaly Cardio: Normal S1 and S2, no S3 or S4. Rhythm is regular. No murmurs or rubs.   Pulm: Clear to auscultation bilaterally anteriorly, no crackles, wheezing, or diminished breath sounds. Normal respiratory effort. Wound site from thoracentesis:dry and clean dressing, no erythema. Draining bloody serous liquid.  Abdomen: Bowel sounds normal. Abdomen soft and non-tender. Foley catheter in situ. Draining clear urine.  Extremities: No peripheral edema. Warm/ well perfused.  Strong radial pulse. Bilateral SCDs. Neuro: Cranial nerves grossly intact  Laboratory: Recent Labs  Lab 01/06/19 0857 01/07/19 0617 01/07/19  2242  WBC 10.3 11.5* 11.8*  HGB 14.4 12.6* 12.0*  HCT 44.3 38.3* 35.9*  PLT 392 441* 433*   Recent Labs  Lab 01/06/19 0857 01/07/19 0617 01/07/19 2242  NA 131* 130* 129*  K 3.7 3.7 3.9  CL 89* 90* 89*  CO2 27  24 27   BUN 8 9 14   CREATININE 0.91 0.92 1.03  CALCIUM 9.4 9.2 9.0  PROT  --  6.8 6.6  BILITOT  --  0.4 0.2*  ALKPHOS  --  78 96  ALT  --  37 35  AST  --  32 27  GLUCOSE 131* 116* 163*      Imaging/Diagnostic Tests: Dg Chest 2 View  Result Date: 01/06/2019 CLINICAL DATA:  Shortness of breath. Chest pain. Pleural effusion. Chest tube. EXAM: CHEST - 2 VIEW COMPARISON:  No recent prior. FINDINGS: Heart size normal. Right base atelectasis/infiltrate and right-sided pleural effusion. Right chest tube noted with tip over the right lower chest. No pneumothorax. Mild patchy infiltrates in the left lung cannot be excluded. Degenerative change thoracic spine. IMPRESSION: 1. Right base atelectasis/infiltrate right-sided pleural effusion. Right chest tube noted with tip over the right lower chest. No pneumothorax. 2. Mild patchy infiltrates in left lung cannot be excluded. Electronically Signed   By: Marcello Moores  Register   On: 01/06/2019 09:27   Ct Chest W Contrast  Result Date: 01/06/2019 CLINICAL DATA:  Chest pain, shortness of breath, back pain and recent history of right pleural effusion with tunneled PleurX catheter placement at outside institution. Chest x-ray demonstrates component of right-sided pleural fluid and pulmonary airspace opacity. EXAM: CT CHEST WITH CONTRAST TECHNIQUE: Multidetector CT imaging of the chest was performed during intravenous contrast administration. CONTRAST:  48mL OMNIPAQUE IOHEXOL 300 MG/ML  SOLN COMPARISON:  Chest x-ray earlier today. FINDINGS: Cardiovascular: The ascending thoracic aorta is mildly dilated and measures 4 cm in maximal measured diameter. The aortic root is normal in caliber and measures 3.4-3.6 cm. The proximal arch measures 3.8 cm and the distal arch 2.8 cm. The descending thoracic aorta measures 2.6 cm. No evidence of aortic dissection. Proximal great vessels are normally patent and demonstrate bovine branching anatomy. The pulmonary arteries are also well  opacified and demonstrates normal caliber without visible pulmonary embolism. Mediastinum/Nodes: No evidence of mediastinal lymphadenopathy or mass. Mildly prominent right hilar lymph node measures approximately 1.5 cm. Unremarkable thyroid gland, of CT appearance of the esophagus and trachea. Lungs/Pleura: Tunneled pleural drainage catheter enters the lateral pleural space and ascends posteriorly. There is a small amount of loculated pleural fluid present in the pleural space with associated pleural thickening. Parenchymal atelectasis and scarring present with lack of complete expansion of the right lung. There are multiple bilateral ill-defined pulmonary nodules in both upper and lower lobes. These are scattered predominantly in a peripheral distribution and are all approximately 7 mm or less in size. These are not smoothly demarcated nodules and may be inflammatory in nature. However, metastatic disease cannot be excluded and correlation suggested with any known diagnosis of malignancy. No pneumothorax, pulmonary edema or significant focal airspace consolidation is identified. Upper Abdomen: Focal hypervascular focus in a relatively arterial phase of imaging in the liver in segment III measures approximately 1.7 cm in greatest diameter. Differential considerations include hemangioma, perfusion anomaly or hypervascular mass. No other focal abnormalities are identified in the visualized upper to mid liver. Musculoskeletal: No fractures or focal bony lesions identified. IMPRESSION: 1. Mildly dilated ascending thoracic aorta measures 4 cm in greatest diameter. Recommend annual  imaging followup by CTA or MRA. This recommendation follows 2010 ACCF/AHA/AATS/ACR/ASA/SCA/SCAI/SIR/STS/SVM Guidelines for the Diagnosis and Management of Patients with Thoracic Aortic Disease. Circulation. 2010; 121: Q676-P950. Aortic aneurysm NOS (ICD10-I71.9) 2. Mildly enlarged right hilar lymph node measures 1.5 cm in short axis. 3. Small  amount of loculated right pleural fluid with associated pleural thickening and restricted expansion of the right lung. Indwelling PleurX catheter present. 4. Multiple ill-defined bilateral small pulmonary nodules all measuring under 1 cm. These may be inflammatory in nature based on appearance. However, metastatic disease is not excluded and correlation suggested with any known diagnosis of malignancy. 5. 1.7 cm hypervascular lesion in segment III of the liver with differential including hemangioma, perfusion anomaly or hypervascular mass. Correlation suggested with any prior abdominal CT or MRI imaging. Aortic aneurysm NOS (ICD10-I71.9). Electronically Signed   By: Aletta Edouard M.D.   On: 01/06/2019 14:19   Ct Abdomen Pelvis W Contrast  Result Date: 01/07/2019 CLINICAL DATA:  63 year old male with history of 6 weeks of worsening dyspnea and right-sided chest pain. Non-small cell carcinoma diagnosed recently by thoracentesis. Hypervascular lesion noted in the liver on recent CT examination. EXAM: CT ABDOMEN AND PELVIS WITH CONTRAST TECHNIQUE: Multidetector CT imaging of the abdomen and pelvis was performed using the standard protocol following bolus administration of intravenous contrast. CONTRAST:  153mL OMNIPAQUE IOHEXOL 300 MG/ML  SOLN COMPARISON:  No priors. FINDINGS: Lower chest: Unchanged compared with yesterday's CT examination (see that study for further details). 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 No other suspicious hepatic lesions are noted. No intra or extrahepatic biliary ductal dilatation. Gallbladder is unremarkable in appearance. Pancreas: No pancreatic mass. No pancreatic ductal dilatation. Mild haziness in peripancreatic  fat, particularly adjacent to the inferior aspect of the pancreas. No well-defined peripancreatic fluid collections. Spleen: Unremarkable. Adrenals/Urinary Tract: Low-attenuation lesions in the upper pole the left kidney measuring up to 2.8 cm in diameter, compatible with simple cysts. Right kidney and bilateral adrenal glands are normal in appearance. No hydroureteronephrosis. Urinary bladder is normal in appearance. Stomach/Bowel: Normal appearance of the stomach. No pathologic dilatation of small bowel or colon. Numerous colonic diverticuli are noted, without surrounding inflammatory changes to suggest an acute diverticulitis at this time. Normal appendix. Vascular/Lymphatic: Aortic atherosclerosis, without evidence of aneurysm or dissection in the abdominal or pelvic vasculature. No lymphadenopathy noted in the abdomen or pelvis. Reproductive: Prostate gland and seminal vesicles are unremarkable in appearance. Other: No significant volume of ascites.  No pneumoperitoneum. Musculoskeletal: There are no aggressive appearing lytic or blastic lesions noted in the visualized portions of the skeleton. IMPRESSION: 1. No definite findings in the abdomen or pelvis to strongly suggest the presence of metastatic disease. 2. 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 This could be definitively characterized with MRI of the abdomen with and without IV gadolinium if of clinical concern, or could simply be followed on routine follow-up examinations. 3. Haziness in the root of the small bowel mesentery inferior to the pancreas. This can sometimes be seen and can be benign. Alternatively, this could be seen in the setting of mild acute pancreatitis. Correlation with lipase levels is suggested. 4. Aortic atherosclerosis. Electronically  Signed   By: Vinnie Langton M.D.   On: 01/07/2019 14:08     Lattie Haw, MD 01/08/2019, 6:03 AM PGY-1, [place] Intern pager: [phone], text pages welcome

## 2019-01-08 NOTE — Anesthesia Procedure Notes (Signed)
Procedure Name: Intubation Date/Time: 01/08/2019 7:43 AM Performed by: Orlie Dakin, CRNA Pre-anesthesia Checklist: Patient identified, Emergency Drugs available, Suction available and Patient being monitored Patient Re-evaluated:Patient Re-evaluated prior to induction Oxygen Delivery Method: Circle system utilized Preoxygenation: Pre-oxygenation with 100% oxygen Induction Type: IV induction Ventilation: Mask ventilation without difficulty Laryngoscope Size: 4 and Glidescope Grade View: Grade I Tube type: Oral Endobronchial tube: Left, Double lumen EBT, EBT position confirmed by fiberoptic bronchoscope and EBT position confirmed by auscultation and 39 Fr Number of attempts: 2 Airway Equipment and Method: Stylet and Fiberoptic brochoscope Placement Confirmation: ETT inserted through vocal cords under direct vision,  positive ETCO2 and breath sounds checked- equal and bilateral Tube secured with: Tape Dental Injury: Teeth and Oropharynx as per pre-operative assessment  Comments: 1st DL MAC 4, grade 3, large floppy epiglottis, anterior anatomy and only inferior arytenoids visualized.  2nd DL Glidescope, grade 1 view and as above.

## 2019-01-08 NOTE — Anesthesia Postprocedure Evaluation (Signed)
Anesthesia Post Note  Patient: Andrew Davenport  Procedure(s) Performed: VIDEO ASSISTED THORACOSCOPY (Right Chest) PLEURAL BIOPSY (Right )     Patient location during evaluation: PACU Anesthesia Type: General Level of consciousness: awake Pain management: pain level controlled Vital Signs Assessment: post-procedure vital signs reviewed and stable Respiratory status: spontaneous breathing Cardiovascular status: stable Postop Assessment: no apparent nausea or vomiting Anesthetic complications: no    Last Vitals:  Vitals:   01/08/19 1600 01/08/19 1619  BP: 115/79   Pulse:    Resp: 19 14  Temp:    SpO2: 99% 99%    Last Pain:  Vitals:   01/08/19 1619  TempSrc:   PainSc: 6                  Edmar Blankenburg

## 2019-01-08 NOTE — Brief Op Note (Signed)
01/06/2019 - 01/08/2019  9:17 AM  PATIENT:  Andrew Davenport  63 y.o. male  PRE-OPERATIVE DIAGNOSIS:  LUNG CANCER  POST-OPERATIVE DIAGNOSIS:  LUNG CANCER  PROCEDURE:  Procedure(s):  VIDEO ASSISTED THORACOSCOPY (Right) PLEURAL BIOPSY (Right)  SURGEON:  Surgeon(s) and Role:    * [name], MD - Primary  PHYSICIAN ASSISTANT: Ellwood Handler PA-C  ANESTHESIA:   general  EBL: Minimal  BLOOD ADMINISTERED:none  DRAINS: Previously placed Pleur-x drainage catheter   LOCAL MEDICATIONS USED:  NONE  SPECIMEN:  Source of Specimen:  Pleural Biopsy  DISPOSITION OF SPECIMEN:  PATHOLOGY  COUNTS:  YES  TOURNIQUET:  * No tourniquets in log *  DICTATION: .Dragon Dictation  PLAN OF CARE: Admit to inpatient   PATIENT DISPOSITION:  Return to 2W   Delay start of Pharmacological VTE agent (>24hrs) due to surgical blood loss or risk of bleeding: no

## 2019-01-09 ENCOUNTER — Inpatient Hospital Stay (HOSPITAL_COMMUNITY): Payer: No Typology Code available for payment source

## 2019-01-09 ENCOUNTER — Encounter (HOSPITAL_COMMUNITY): Payer: Self-pay | Admitting: Thoracic Surgery (Cardiothoracic Vascular Surgery)

## 2019-01-09 LAB — GLUCOSE, CAPILLARY
Glucose-Capillary: 161 mg/dL — ABNORMAL HIGH (ref 70–99)
Glucose-Capillary: 142 mg/dL — ABNORMAL HIGH (ref 70–99)
Glucose-Capillary: 170 mg/dL — ABNORMAL HIGH (ref 70–99)
Glucose-Capillary: 148 mg/dL — ABNORMAL HIGH (ref 70–99)

## 2019-01-09 LAB — CBC
HCT: 32.9 % — ABNORMAL LOW (ref 39.0–52.0)
Hemoglobin: 10.7 g/dL — ABNORMAL LOW (ref 13.0–17.0)
MCH: 27.9 pg (ref 26.0–34.0)
MCHC: 32.5 g/dL (ref 30.0–36.0)
MCV: 85.7 fL (ref 80.0–100.0)
Platelets: 404 10*3/uL — ABNORMAL HIGH (ref 150–400)
RBC: 3.84 MIL/uL — ABNORMAL LOW (ref 4.22–5.81)
RDW: 14 % (ref 11.5–15.5)
WBC: 16.5 10*3/uL — ABNORMAL HIGH (ref 4.0–10.5)
nRBC: 0 % (ref 0.0–0.2)

## 2019-01-09 LAB — BASIC METABOLIC PANEL
Anion gap: 13 (ref 5–15)
BUN: 6 mg/dL (ref 6–20)
CO2: 26 mmol/L (ref 22–32)
Calcium: 9 mg/dL (ref 8.9–10.3)
Chloride: 94 mmol/L — ABNORMAL LOW (ref 98–111)
Creatinine, Ser: 0.73 mg/dL (ref 0.61–1.24)
GFR calc Af Amer: >60 mL/min (ref >60)
GFR calc non Af Amer: >60 mL/min (ref >60)
Glucose, Bld: 141 mg/dL — ABNORMAL HIGH (ref 70–99)
Potassium: 4.3 mmol/L (ref 3.5–5.1)
Sodium: 133 mmol/L — ABNORMAL LOW (ref 135–145)

## 2019-01-09 MED ORDER — HYDROCODONE-ACETAMINOPHEN 5-325 MG PO TABS
2.0000 | ORAL_TABLET | ORAL | Status: DC
  Administered 2019-01-09 – 2019-01-13 (×19): 2 via ORAL
  Filled 2019-01-09 (×19): qty 2

## 2019-01-09 MED ORDER — MORPHINE SULFATE (PF) 2 MG/ML IV SOLN
2.0000 mg | Freq: Once | INTRAVENOUS | Status: DC
  Filled 2019-01-09: qty 1

## 2019-01-09 MED ORDER — LORAZEPAM 2 MG/ML IJ SOLN
2.0000 mg | Freq: Once | INTRAMUSCULAR | Status: AC | PRN
  Administered 2019-01-12: 2 mg via INTRAVENOUS
  Filled 2019-01-09: qty 1

## 2019-01-09 MED ORDER — DIAZEPAM 2 MG PO TABS: 2.0000 mg | ORAL_TABLET | Freq: Once | ORAL | Status: DC

## 2019-01-09 MED ORDER — DIAZEPAM 2 MG PO TABS: 2.0000 mg | ORAL_TABLET | ORAL | Status: DC | PRN

## 2019-01-09 MED ORDER — HYDROCODONE-ACETAMINOPHEN 5-325 MG PO TABS: 2.0000 | ORAL_TABLET | ORAL | Status: DC | PRN

## 2019-01-09 NOTE — Progress Notes (Signed)
Chart reviewed.  Attempted to see patient 8/7 - he was in procedure.  Revisited 8/8 he is in MR.  I will stop back by later today.  Florentina Jenny, PA-C Palliative Medicine Pager: [phone]  NO charge note.

## 2019-01-09 NOTE — Progress Notes (Signed)
Family Medicine Teaching Service Daily Progress Note Intern Pager: [phone]  Patient name: Andrew Davenport Medical record number: [id number] Date of birth: 11/20/1955 Age: 63 y.o. Gender: male  Primary Care Provider: Patient, No Pcp Per Consultants: IR, oncology, palliative, CVTS Code Status: Full  Pt Overview and Major Events to Date:  01/06/19 - Admitted to [facility], CT chest: loculated R pleural fluid, multiple bilateral small pulm nodules, R hilar lymph node 01/07/19 - CT abd/pelvis: liver lesion suspect flash fill cavernous hemangioma 01/08/19 - VATS and pleural biopsy, PleurX catheter replaced  Assessment and Plan: Andrew Davenport is a 63 y.o. male presenting with progressive SOB and right-sided chest pain. PMH is significant for small cell carcinoma, recurrent pleural effusions requiring thoracocentesis and type 2 diabetes.  Hypoxemic respiratory failure 2/2 adenocarcinoma/malignant pleural effusions POD 2 s/p Right VATS and pleural biopsy. Bandage clean and intact. Pain well managed with sch'd ibuprofen and 2xNorco 5-325 q4 with no PRN Tramadol needed.  Dyspnea continues and he endorses some increased pressure. Unable to obtain fluid from Pleurex this AM. Some concern for re-accumulation of fluid however no crackles appreciated on exam. Satting well on 2L O2, moderately tachpyneic overnight (RR 20-30s). Pleuravac suction: 150 mLs overnight, removed from suction yesterday. Unable to perform MRI yesterday given anxiety. Tissue culture NGTD.  -CVTS-appreciate recs             -Pleurex  -Oncology-appreciate recs            -MRI brain with contrast-ordered, pending             -Palliative consulted, awaiting recs -Continue to monitor and document Pleurx fluid amount and color -Continue to monitor vitals - continue pani regimen as below - IV Ativan 2mg  x 1 prior to MRI - 1 view CXR to evaluate for re-accumulation of fluid   Pain 2/2 to malignancy/pleural effusions PCA discontinued. Pain  well controlled on scheduled PO ibuprofen and Norco with PRN tramadol. Required no Tramadol overnight.  - Continue Norco 5-325 two PO q4 hours - Continue Tramadol 50-100mg  q6 PRN  - Ibuprofen 600 mg Q6Hrly  - Monitor pain   Type 2 diabetes mellitus, well-controlled  Recent diagnosis. A1C (12/21/18) 7.4.  Home meds: Metformin 750mg  QD. CBG's 140-170 over last 24 hours.  -Continue to monitor CBGs -sSSI  Hyponatremia: stable Na 616>073>710. 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 No signs of infection.  -Continue to monitor  GERD: Home meds: Protonix 40mg  QD -continue home  meds  FEN/GI: Heart healthy diet, Protonix PPx: Lovenox  Disposition: Management of pleural effusions  Subjective:  Patient grumpy this AM. Pain notes it was well controlled but does get really bad when he moves. He notes the pain was really bad when he moves. He feels more pressure in his lung and difficulty breathing. Appetite has much improved. Denies any nausea, vomiting, fevers.  Objective: Temp:  [98 F (36.7 C)-98.5 F (36.9 C)] 98.5 F (36.9 C) (08/08 1945) Pulse Rate:  [90-102] 102 (08/08 1945) Resp:  [12-29] 23 (08/08 1945) BP: (98-146)/(71-97) 117/71 (08/08 1945) SpO2:  [85 %-100 %] 100 % (08/08 1945) Weight:  [111.7 kg] 111.7 kg (08/08 0600)   Physical Exam: General: well nourished, well developed, in no acute distress with non-toxic appearance, sitting up comfortably in bed HEENT: normocephalic, atraumatic, moist mucous membranes CV: regular rate and rhythm without murmurs, rubs, or gallops, mild LE swelling Lungs: mildly decreased  breath sounds on left compared to right, normal WOB on 2-3L O2, no crackles or wheezing, Incision site healing well, no erythema or pus.  Dressings dry and clean. Abdomen: soft, non-tender, non-distended Skin: warm, dry Extremities: warm and well  perfused Neuro: Alert and oriented, speech normal  Laboratory: Recent Labs  Lab 01/07/19 0617 01/07/19 2242 01/09/19 0354  WBC 11.5* 11.8* 16.5*  HGB 12.6* 12.0* 10.7*  HCT 38.3* 35.9* 32.9*  PLT 441* 433* 404*   Recent Labs  Lab 01/07/19 0617 01/07/19 2242 01/09/19 0354  NA 130* 129* 133*  K 3.7 3.9 4.3  CL 90* 89* 94*  CO2 24 27 26   BUN 9 14 6   CREATININE 0.92 1.03 0.73  CALCIUM 9.2 9.0 9.0  PROT 6.8 6.6  --   BILITOT 0.4 0.2*  --   ALKPHOS 78 96  --   ALT 37 35  --   AST 32 27  --   GLUCOSE 116* 163* 141*    Imaging/Diagnostic Tests: Dg Chest Port 1 View  Result Date: 01/09/2019 CLINICAL DATA:  Lung cancer, post VATS EXAM: PORTABLE CHEST 1 VIEW COMPARISON:  Chest radiograph from one day prior. FINDINGS: Right internal jugular central venous catheter terminates at the cavoatrial junction. Stable cardiomediastinal silhouette with normal heart size. Right basilar chest tube in place. Stable small basilar right pneumothorax. Falling loss in the right hemithorax is unchanged. Diffuse right pleural thickening is stable. Patchy right lung base consolidation is unchanged. Multiple small scattered vaguely nodular opacities in the left lung. IMPRESSION: 1. Small right basilar pneumothorax with right chest tube in place, stable. 2. Stable diffuse right pleural thickening with volume loss in the right hemithorax. Stable patchy right lung base consolidation. 3. Stable scattered small vaguely nodular opacities in the left lung, see 01/06/2019 chest CT report for details. Electronically Signed   By: Ilona Sorrel M.D.   On: 01/09/2019 09:54   Dg Chest Port 1 View  Result Date: 01/08/2019 CLINICAL DATA:  Post right VATS. EXAM: PORTABLE CHEST 1 VIEW COMPARISON:  CT 01/06/2019. FINDINGS: Right IJ line noted with tip over superior vena cava. Right chest tube noted with tip over right lower chest. Right base pneumothorax noted. Right base atelectasis. Multiple pulmonary nodules are again noted.  Heart size stable no acute bony abnormality. IMPRESSION: 1. Right chest tube noted with tip over right lower chest. right base pneumothorax noted on today's exam. 2.  Right IJ line noted with tip over superior vena cava. 3. Right base atelectasis. Multiple pulmonary nodules are again noted. Critical Value/emergent results were called by telephone at the time of interpretation on 01/08/2019 at 10:08 am to nurse Sharyn Lull, who verbally acknowledged these results. Electronically Signed   By: Marcello Moores  Register   On: 01/08/2019 10:11    Danna Hefty, DO 01/09/2019, 8:43 PM PGY-2, [place] Intern pager: [phone], text pages welcome

## 2019-01-09 NOTE — Progress Notes (Addendum)
     [address]       [place] [address]             [phone]      No events Using PCA  Alert NAD SS CT output, no leak  I/O last 3 completed shifts: In: 4621.1 [P.O.:480; I.V.:3691.1; IV Piggyback:450] Out: 5366 [Urine:3500; Drains:250; Chest Tube:454]  POD 1 s/p Right VATS, pleural biopsy Ok to transition for pleuravac suction, to [facility] for floor

## 2019-01-09 NOTE — Progress Notes (Signed)
Pt came down for his MRI, once in the scanner, he panicked and said he was claustrophobic and in too much pain. He refused the exam, even with his nurse saying she has more meds to give him, he wanted an alternative exam and not an MRI

## 2019-01-09 NOTE — Progress Notes (Addendum)
Family Medicine Teaching Service Daily Progress Note Intern Pager: [phone]  Patient name: Andrew Davenport Medical record number: [id number] Date of birth: 27-May-1956 Age: 63 y.o. Gender: male  Primary Care Provider: Patient, No Pcp Per Consultants: IR, oncology, palliative  Code Status: Full  Pt Overview and Major Events to Date:  Andrew Davenport is a 63 y.o. male presenting with progressive SOB and right-sided chest pain. PMH is significant for small cell carcinoma, recurrent pleural effusions requiring thoracocentesis and type 2 diabetes.  Assessment and Plan:  POD 1 s/p Right VATS, pleural biopsy Hypoxemic respiratory failure 2/2 adenocarcinoma/malignant pleural effusions Experiencing pain postoperatively however managing well with pain PCA.  Pain is worse on movement. Dyspnea has improved following surgery and oxygen therapy Sats 97% on 3L oxygen, RR 19  Pleuravac suction: 750 mLs this morning, bloody serous fluid, -CVTS-appreciate recs             -Patient will move to floor from cardiac ICU             -change to pleurX -Oncology-appreciate recs            -MRI brain with contrast-ordered, pending             -Awaiting palliative consult  -Continue to monitor and document Pleurx fluid amount and color -Continue to monitor vitals  Pain 2/2 to malignancy/pleural effusions Well controlled on morphine PCA -Continue and tramadol 50-100mg  6PRN and morphine PCA 2 mg/ML every 4 hours, transition to p.o. analgesia when able Continue PO analgesia when able to tolerate  -Hydrocodone- acetaminophen 5-325 2 tabs Q4hrly -Ibuprofen 600 mg Q6Hrly  -Monitor pain   Type 2 diabetes mellitus, well-controlled  Recent diagnosis. HbA1c 7.4 12/21/2018 Home meds: metformin 750mg  daily. 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  Wheezing has significantly improved after operation and with oxygen therapy.  Appetite is good at times and is eating well.  No nausea or vomiting.  No fevers.  The catheter is draining well.  Denies bowel movement since prior to surgery.  Patient has been keeping in contact with his family. No new concerns.   Objective: Temp:  [97.5 F (36.4 C)-98.4 F (36.9 C)] 98.1 F (36.7 C) (08/08 0356) Pulse Rate:  [81-90] 87 (08/07 1027) Resp:  [10-29] 15 (08/08 0600) BP: (98-137)/(72-99) 136/91 (08/08 0600) SpO2:  [85 %-100 %] 98 % (08/08 0611) Arterial Line BP: (175)/(86) 175/86 (08/07 0940) Weight:  [111.7 kg] 111.7 kg (08/08 0600)   General: Alert, cooperative, appears to be in no acute distress, nasal cannula 3 L oxygen HEENT: Neck non-tender without lymphadenopathy, masses or thyromegaly Cardio: Normal S1 and S2, no S3 or S4. Rhythm is regular. No murmurs or rubs.   Pulm: Left lung clear on auscultation, reduced air entry throughout right lung, no crackles, wheezing. Normal respiratory effort.  Incision site healing well, no erythema or pus.  Dressings dry and clean. Pleuravac draining 750 mls bloody serous bloody fluid.  Abdomen: Bowel sounds normal. Abdomen soft and non-tender.  Extremities: No peripheral edema. Warm/ well perfused.  Strong  radial pulse. Neuro: Cranial nerves grossly intact   Laboratory: Recent Labs  Lab 01/07/19 0617 01/07/19 2242 01/09/19 0354  WBC 11.5* 11.8* 16.5*  HGB 12.6* 12.0* 10.7*  HCT 38.3* 35.9* 32.9*  PLT 441* 433* 404*   Recent Labs  Lab 01/07/19 0617 01/07/19 2242 01/09/19 0354  NA 130* 129* 133*  K 3.7 3.9 4.3  CL  90* 89* 94*  CO2 24 27 26   BUN 9 14 6   CREATININE 0.92 1.03 0.73  CALCIUM 9.2 9.0 9.0  PROT 6.8 6.6  --   BILITOT 0.4 0.2*  --   ALKPHOS 78 96  --   ALT 37 35  --   AST 32 27  --   GLUCOSE 116* 163* 141*      Imaging/Diagnostic Tests: Ct Abdomen Pelvis W Contrast  Result Date: 01/07/2019 CLINICAL DATA:  63 year old male with history of 6 weeks of worsening dyspnea and right-sided chest pain. Non-small cell carcinoma diagnosed recently by thoracentesis. Hypervascular lesion noted in the liver on recent CT examination. EXAM: CT ABDOMEN AND PELVIS WITH CONTRAST TECHNIQUE: Multidetector CT imaging of the abdomen and pelvis was performed using the standard protocol following bolus administration of intravenous contrast. CONTRAST:  130mL OMNIPAQUE IOHEXOL 300 MG/ML  SOLN COMPARISON:  No priors. FINDINGS: Lower chest: Unchanged compared with yesterday's CT examination (see that study for further details). 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 No other suspicious hepatic lesions are noted. No intra or extrahepatic biliary ductal dilatation. Gallbladder is unremarkable in appearance. Pancreas: No pancreatic mass. No pancreatic ductal dilatation. Mild haziness in peripancreatic fat, particularly adjacent to the inferior aspect of the pancreas. No well-defined peripancreatic fluid collections. Spleen: Unremarkable. Adrenals/Urinary Tract: Low-attenuation lesions in the upper pole the left kidney measuring up to 2.8 cm in diameter, compatible with simple cysts. Right kidney and bilateral adrenal glands are normal in appearance. No hydroureteronephrosis. Urinary bladder is normal in appearance. Stomach/Bowel: Normal appearance of the stomach. No  pathologic dilatation of small bowel or colon. Numerous colonic diverticuli are noted, without surrounding inflammatory changes to suggest an acute diverticulitis at this time. Normal appendix. Vascular/Lymphatic: Aortic atherosclerosis, without evidence of aneurysm or dissection in the abdominal or pelvic vasculature. No lymphadenopathy noted in the abdomen or pelvis. Reproductive: Prostate gland and seminal vesicles are unremarkable in appearance. Other: No significant volume of ascites.  No pneumoperitoneum. Musculoskeletal: There are no aggressive appearing lytic or blastic lesions noted in the visualized portions of the skeleton. IMPRESSION: 1. No definite findings in the abdomen or pelvis to strongly suggest the presence of metastatic disease. 2. 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 This could be definitively characterized with MRI of the abdomen with and without IV gadolinium if of clinical concern, or could simply be followed on routine follow-up examinations. 3. Haziness in the root of the small bowel mesentery inferior to the pancreas. This can sometimes be seen and can be benign. Alternatively, this could be seen in the setting of mild acute pancreatitis. Correlation with lipase levels is suggested. 4. Aortic atherosclerosis. Electronically Signed   By: Vinnie Langton M.D.   On: 01/07/2019 14:08   Dg Chest Port 1  View  Result Date: 01/08/2019 CLINICAL DATA:  Post right VATS. EXAM: PORTABLE CHEST 1 VIEW COMPARISON:  CT 01/06/2019. FINDINGS: Right IJ line noted with tip over superior vena cava. Right chest tube noted with tip over right lower chest. Right base pneumothorax noted. Right base atelectasis. Multiple pulmonary nodules are again noted. Heart size stable no acute bony abnormality. IMPRESSION: 1. Right chest tube noted with tip over right lower chest.  right base pneumothorax noted on today's exam. 2.  Right IJ line noted with tip over superior vena cava. 3. Right base atelectasis. Multiple pulmonary nodules are again noted. Critical Value/emergent results were called by telephone at the time of interpretation on 01/08/2019 at 10:08 am to nurse Sharyn Lull, who verbally acknowledged these results. Electronically Signed   By: Marcello Moores  Register   On: 01/08/2019 10:11    Lattie Haw, MD 01/09/2019, 7:03 AM PGY-1, [place] Intern pager: [phone], text pages welcome

## 2019-01-09 NOTE — Progress Notes (Signed)
PCA d/c on arrival to [facility] 20 MCG morphine wasted in presence of charge RN and receiving RN.

## 2019-01-09 NOTE — Progress Notes (Signed)
Pt transferred from [facility]. Acknowledge order to dc PCA morphine, it was dc with witness of charge nurse. Pt has only one IV access on his right wrist where he is feeling pain and tenderness. Placed the order of IV nurse to start new IV to start his regular fluid. This time pt is sitting at the edge of the bed and eating his dinner. Oxygen given @2l  via [place]. Incision site is CDI and dressed with gauge.  Pt is aware of transferring to different unit. Will continue to monitor the patient  Palma Holter, RN

## 2019-01-10 ENCOUNTER — Inpatient Hospital Stay (HOSPITAL_COMMUNITY): Payer: No Typology Code available for payment source

## 2019-01-10 DIAGNOSIS — E1122 Type 2 diabetes mellitus with diabetic chronic kidney disease: Secondary | ICD-10-CM

## 2019-01-10 DIAGNOSIS — E871 Hypo-osmolality and hyponatremia: Secondary | ICD-10-CM

## 2019-01-10 DIAGNOSIS — Z515 Encounter for palliative care: Secondary | ICD-10-CM

## 2019-01-10 LAB — COMPREHENSIVE METABOLIC PANEL
ALT: 29 U/L (ref 0–44)
AST: 27 U/L (ref 15–41)
Albumin: 3.1 g/dL — ABNORMAL LOW (ref 3.5–5.0)
Alkaline Phosphatase: 83 U/L (ref 38–126)
Anion gap: 14 (ref 5–15)
BUN: 11 mg/dL (ref 6–20)
CO2: 24 mmol/L (ref 22–32)
Calcium: 9.1 mg/dL (ref 8.9–10.3)
Chloride: 95 mmol/L — ABNORMAL LOW (ref 98–111)
Creatinine, Ser: 0.85 mg/dL (ref 0.61–1.24)
GFR calc Af Amer: >60 mL/min (ref >60)
GFR calc non Af Amer: >60 mL/min (ref >60)
Glucose, Bld: 151 mg/dL — ABNORMAL HIGH (ref 70–99)
Potassium: 4.1 mmol/L (ref 3.5–5.1)
Sodium: 133 mmol/L — ABNORMAL LOW (ref 135–145)
Total Bilirubin: 0.3 mg/dL (ref 0.3–1.2)
Total Protein: 6.3 g/dL — ABNORMAL LOW (ref 6.5–8.1)

## 2019-01-10 LAB — CBC
HCT: 35.8 % — ABNORMAL LOW (ref 39.0–52.0)
Hemoglobin: 11.4 g/dL — ABNORMAL LOW (ref 13.0–17.0)
MCH: 27.5 pg (ref 26.0–34.0)
MCHC: 31.8 g/dL (ref 30.0–36.0)
MCV: 86.5 fL (ref 80.0–100.0)
Platelets: 431 10*3/uL — ABNORMAL HIGH (ref 150–400)
RBC: 4.14 MIL/uL — ABNORMAL LOW (ref 4.22–5.81)
RDW: 14.4 % (ref 11.5–15.5)
WBC: 14.2 10*3/uL — ABNORMAL HIGH (ref 4.0–10.5)
nRBC: 0 % (ref 0.0–0.2)

## 2019-01-10 LAB — GLUCOSE, CAPILLARY
Glucose-Capillary: 156 mg/dL — ABNORMAL HIGH (ref 70–99)
Glucose-Capillary: 117 mg/dL — ABNORMAL HIGH (ref 70–99)
Glucose-Capillary: 123 mg/dL — ABNORMAL HIGH (ref 70–99)
Glucose-Capillary: 167 mg/dL — ABNORMAL HIGH (ref 70–99)

## 2019-01-10 MED ORDER — FLUTICASONE PROPIONATE 50 MCG/ACT NA SUSP
2.0000 | Freq: Every day | NASAL | Status: DC
  Administered 2019-01-10 – 2019-01-12 (×4): 2 via NASAL
  Filled 2019-01-10: qty 16

## 2019-01-10 NOTE — Progress Notes (Addendum)
pleurX drainage performed @4 .50pm. Total drainage amount is 350cc, color tan pink,no SOB and distress noted, pt tolerated well  Palma Holter, RN

## 2019-01-10 NOTE — Discharge Summary (Signed)
[facility] Discharge Summary  Patient name: Andrew Davenport Medical record number: [id number] Date of birth: 1955-08-12 Age: 63 y.o. Gender: male Date of Admission: 01/06/2019  Date of Discharge: 01/12/2019 Admitting Physician: [name], MD  Primary Care Provider: Patient, No Pcp Per Consultants: CVTS, oncology, palliative   Indication for Hospitalization: Andrew Davenport is a 63 y.o. male presenting with progressive SOB and right-sided chest pain. PMH is significant for small cell carcinoma, recurrent pleural effusions requiring thoracocentesis.  Discharge Diagnoses/Problem List:  Stage IV adenocarcinoma of lung  Right malignant pleural effusion Type 2 diabetes mellitus GERD  Disposition: Home  Discharge Condition: Stable  Discharge Exam:   Objective: Temp:  [97.8 F (36.6 C)-98.6 F (37 C)] 98.4 F (36.9 C) (08/11 0446) Pulse Rate:  [80-98] 88 (08/10 1639) Resp:  [11-28] 28 (08/11 0446) BP: (112-135)/(63-89) 135/89 (08/11 0446) SpO2:  [100 %] 100 % (08/10 1639)   Physical Exam: General: Alert and cooperative and appears to be in no acute distress resting comfortably in bed HEENT: Atraumatic normocephalic Cardio: Normal S1 and S2, no S3 or S4. Rhythm is regular. No murmurs or rubs.   Pulm: Clear to auscultation bilaterally, no crackles, wheezing, or diminished breath sounds.  4L O2.  Incision site healing well with no erythema.  Dressings applied are clean and dry. Abdomen: Bowel sounds normal. Abdomen soft and non-tender.  Extremities: Mild lower extremity edema bilaterally warm/well perfused Neuro: Cranial nerves grossly intact  Brief Hospital Course:  Andrew Davenport was admitted following a 6-week history of progressive shortness of breath, right-sided chest pain and a change in his pleural fluid color from dark red to straw yellow.  He had been admitted prior to [facility] with similar symptoms and found to have a right-sided  pleural effusion imaging. Patient had had 3 X thoracocentesis at this hospital with pleural fluid awaiting cytology and discharged with a Pleurx drain. He was given a diagnosis of non-small cell carcinoma of the lung when he presented to the ED at [facility]. CVTS were consulted and he had VATS with biopsy on 8/7.  Biopsy results unfortunately showed adenocarcinoma of the lung.  Immunohistochemistry is pending.  Pt's pain was managed with Norco 10mg  Q4hrly, ibuprofen 600mg  Q6hrly and tramadol 50-100mg  Q6PRN prior and post operatively- morphine PCA. Patient had significant pleuritic pain from cancer found in pleural lining.   Oncology were consulted who advised likely stage 4 adenocarcinoma. Requested MRI brain which showed no evidence of metastatic disease and CT abdomen and pelvis which showed: No definite findings in the abdomen or pelvis to strongly suggest metastatic disease. 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 Haziness in the root of the small bowel mesentery inferior to the pancreas. Could resemble pancreatitis however lipase level normal.   Palliative care were consulted and had discussions with [name] regarding his goals of care. He understands his cancer is a terminal illness. Would like to maintain the best quality of life in order to spend time with his grandchildren. He is not interested in chemotherapy if it will deteriorate his quality of life. Pt is now DNR.  Issues for Follow Up:  1. Hospice and Palliative services follow up as outpatient.  2. Oncology follow up on 01/19/19 3. F/u with CVTS outpatient in 2-3 weeks. They have made appointment 4. Follow-up on immunohistochemistry of the lung biopsy  Significant Procedures:   VATS with biopsy on 8/7  MRI 8/11  Significant Labs and Imaging:  Recent Labs  Lab 01/09/19 0354 01/10/19 0222 01/11/19 0240  WBC 16.5* 14.2* 11.6*  HGB 10.7* 11.4*  10.6*  HCT 32.9* 35.8* 32.6*  PLT 404* 431* 371   Recent Labs  Lab 01/07/19 0617 01/07/19 2242 01/09/19 0354 01/10/19 0222 01/11/19 0240  NA 130* 129* 133* 133* 131*  K 3.7 3.9 4.3 4.1 4.3  CL 90* 89* 94* 95* 97*  CO2 24 27 26 24 24   GLUCOSE 116* 163* 141* 151* 159*  BUN 9 14 6 11 10   CREATININE 0.92 1.03 0.73 0.85 0.69  CALCIUM 9.2 9.0 9.0 9.1 8.8*  ALKPHOS 78 96  --  83  --   AST 32 27  --  27  --   ALT 37 35  --  29  --   ALBUMIN 3.1* 3.1*  --  3.1*  --     Dg Chest 2 View  Result Date: 01/06/2019 CLINICAL DATA:  Shortness of breath. Chest pain. Pleural effusion. Chest tube. EXAM: CHEST - 2 VIEW COMPARISON:  No recent prior. FINDINGS: Heart size normal. Right base atelectasis/infiltrate and right-sided pleural effusion. Right chest tube noted with tip over the right lower chest. No pneumothorax. Mild patchy infiltrates in the left lung cannot be excluded. Degenerative change thoracic spine. IMPRESSION: 1. Right base atelectasis/infiltrate right-sided pleural effusion. Right chest tube noted with tip over the right lower chest. No pneumothorax. 2. Mild patchy infiltrates in left lung cannot be excluded. Electronically Signed   By: Marcello Moores  Register   On: 01/06/2019 09:27   Ct Chest W Contrast  Result Date: 01/06/2019 CLINICAL DATA:  Chest pain, shortness of breath, back pain and recent history of right pleural effusion with tunneled PleurX catheter placement at outside institution. Chest x-ray demonstrates component of right-sided pleural fluid and pulmonary airspace opacity. EXAM: CT CHEST WITH CONTRAST TECHNIQUE: Multidetector CT imaging of the chest was performed during intravenous contrast administration. CONTRAST:  51mL OMNIPAQUE IOHEXOL 300 MG/ML  SOLN COMPARISON:  Chest x-ray earlier today. FINDINGS: Cardiovascular: The ascending thoracic aorta is mildly dilated and measures 4 cm in maximal measured diameter. The aortic root is normal in caliber and measures 3.4-3.6 cm. The  proximal arch measures 3.8 cm and the distal arch 2.8 cm. The descending thoracic aorta measures 2.6 cm. No evidence of aortic dissection. Proximal great vessels are normally patent and demonstrate bovine branching anatomy. The pulmonary arteries are also well opacified and demonstrates normal caliber without visible pulmonary embolism. Mediastinum/Nodes: No evidence of mediastinal lymphadenopathy or mass. Mildly prominent right hilar lymph node measures approximately 1.5 cm. Unremarkable thyroid gland, of CT appearance of the esophagus and trachea. Lungs/Pleura: Tunneled pleural drainage catheter enters the lateral pleural space and ascends posteriorly. There is a small amount of loculated pleural fluid present in the pleural space with associated pleural thickening. Parenchymal atelectasis and scarring present with lack of complete expansion of the right lung. There are multiple bilateral ill-defined pulmonary nodules in both upper and lower lobes. These are scattered predominantly in a peripheral distribution and are all approximately 7 mm or less in size. These are not smoothly demarcated nodules and may be inflammatory in nature. However, metastatic disease cannot be excluded and correlation suggested with any known diagnosis of malignancy. No pneumothorax, pulmonary edema or significant focal airspace consolidation is identified. Upper Abdomen: Focal hypervascular focus in a relatively arterial phase of imaging in the liver in segment III measures approximately 1.7 cm in greatest diameter. Differential considerations include hemangioma, perfusion anomaly  or hypervascular mass. No other focal abnormalities are identified in the visualized upper to mid liver. Musculoskeletal: No fractures or focal bony lesions identified. IMPRESSION: 1. Mildly dilated ascending thoracic aorta measures 4 cm in greatest diameter. Recommend annual imaging followup by CTA or MRA. This recommendation follows 2010  ACCF/AHA/AATS/ACR/ASA/SCA/SCAI/SIR/STS/SVM Guidelines for the Diagnosis and Management of Patients with Thoracic Aortic Disease. Circulation. 2010; 121: Z025-E527. Aortic aneurysm NOS (ICD10-I71.9) 2. Mildly enlarged right hilar lymph node measures 1.5 cm in short axis. 3. Small amount of loculated right pleural fluid with associated pleural thickening and restricted expansion of the right lung. Indwelling PleurX catheter present. 4. Multiple ill-defined bilateral small pulmonary nodules all measuring under 1 cm. These may be inflammatory in nature based on appearance. However, metastatic disease is not excluded and correlation suggested with any known diagnosis of malignancy. 5. 1.7 cm hypervascular lesion in segment III of the liver with differential including hemangioma, perfusion anomaly or hypervascular mass. Correlation suggested with any prior abdominal CT or MRI imaging. Aortic aneurysm NOS (ICD10-I71.9). Electronically Signed   By: Aletta Edouard M.D.   On: 01/06/2019 14:19   Mr Jeri Cos PO Contrast  Result Date: 01/12/2019 CLINICAL DATA:  Lung cancer staging EXAM: MRI HEAD WITHOUT AND WITH CONTRAST TECHNIQUE: Multiplanar, multiecho pulse sequences of the brain and surrounding structures were obtained without and with intravenous contrast. CONTRAST:  10 cc Gadavist intravenous COMPARISON:  None. FINDINGS: Brain: No evidence of enhancement or swelling to suggest metastatic disease. Axial and coronal postcontrast imaging is moderately degraded by motion, which could obscure subtle findings. Postcontrast sagittal imaging is nondiagnostic due to motion. No infarct, hemorrhage, hydrocephalus, or collection. Age normal brain volume Vascular: Major flow voids and vascular enhancements are preserved Skull and upper cervical spine: Negative for marrow lesion Sinuses/Orbits: Left cataract resection.  Negative for mass. IMPRESSION: No evidence of metastatic disease. Moderately motion degraded study due to  claustrophobia Electronically Signed   By: Monte Fantasia M.D.   On: 01/12/2019 11:35   Ct Abdomen Pelvis W Contrast  Result Date: 01/07/2019 CLINICAL DATA:  63 year old male with history of 6 weeks of worsening dyspnea and right-sided chest pain. Non-small cell carcinoma diagnosed recently by thoracentesis. Hypervascular lesion noted in the liver on recent CT examination. EXAM: CT ABDOMEN AND PELVIS WITH CONTRAST TECHNIQUE: Multidetector CT imaging of the abdomen and pelvis was performed using the standard protocol following bolus administration of intravenous contrast. CONTRAST:  115mL OMNIPAQUE IOHEXOL 300 MG/ML  SOLN COMPARISON:  No priors. FINDINGS: Lower chest: Unchanged compared with yesterday's CT examination (see that study for further details). 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 No other suspicious hepatic lesions are noted. No intra or extrahepatic biliary ductal dilatation. Gallbladder is unremarkable in appearance. Pancreas: No pancreatic mass. No pancreatic ductal dilatation. Mild haziness in peripancreatic fat, particularly adjacent to the inferior aspect of the pancreas. No well-defined peripancreatic fluid collections. Spleen: Unremarkable. Adrenals/Urinary Tract: Low-attenuation lesions in the upper pole the left kidney measuring up to 2.8 cm in diameter, compatible with simple cysts. Right kidney and bilateral adrenal glands are normal in appearance. No hydroureteronephrosis. Urinary bladder is normal in appearance. Stomach/Bowel: Normal appearance of the stomach. No pathologic dilatation of small bowel or colon. Numerous colonic diverticuli are noted, without surrounding inflammatory changes to  suggest an acute  diverticulitis at this time. Normal appendix. Vascular/Lymphatic: Aortic atherosclerosis, without evidence of aneurysm or dissection in the abdominal or pelvic vasculature. No lymphadenopathy noted in the abdomen or pelvis. Reproductive: Prostate gland and seminal vesicles are unremarkable in appearance. Other: No significant volume of ascites.  No pneumoperitoneum. Musculoskeletal: There are no aggressive appearing lytic or blastic lesions noted in the visualized portions of the skeleton. IMPRESSION: 1. No definite findings in the abdomen or pelvis to strongly suggest the presence of metastatic disease. 2. 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 This could be definitively characterized with MRI of the abdomen with and without IV gadolinium if of clinical concern, or could simply be followed on routine follow-up examinations. 3. Haziness in the root of the small bowel mesentery inferior to the pancreas. This can sometimes be seen and can be benign. Alternatively, this could be seen in the setting of mild acute pancreatitis. Correlation with lipase levels is suggested. 4. Aortic atherosclerosis. Electronically Signed   By: Vinnie Langton M.D.   On: 01/07/2019 14:08   Dg Chest Port 1 View  Result Date: 01/10/2019 CLINICAL DATA:  63 year old male with history of pleural effusion. EXAM: PORTABLE CHEST 1 VIEW COMPARISON:  Chest x-ray 01/09/2019. FINDINGS: Right-sided chest tube in position with tip projecting over the lower right hemithorax. Moderate right pleural effusion again noted. No appreciable right pneumothorax confidently identified on today's examination. Irregular opacities throughout the right mid to lower lung may reflect areas of atelectasis and/or consolidation. A few scattered ill-defined reticulonodular opacities are noted throughout the left mid to lower  lung, corresponding to scattered areas of nodularity seen on prior CT the abdomen and pelvis 01/07/2019. Left lung is otherwise clear. No left pleural effusion. No evidence of pulmonary edema. Heart size is normal. IMPRESSION: 1. Read chemo elation of right-sided pleural fluid. Moderate pleural effusion noted at this time. No residual pneumothorax confidently identified. 2. Patchy ill-defined opacities throughout the lungs bilaterally (right greater than left). Based on correlation with prior chest CT, findings are favored to reflect metastatic disease with potential lymphangitic spread of tumor in the right lung. Underlying infectious consolidation is not entirely excluded, but is not strongly favored. Electronically Signed   By: Vinnie Langton M.D.   On: 01/10/2019 09:41   Dg Chest Port 1 View  Result Date: 01/09/2019 CLINICAL DATA:  Lung cancer, post VATS EXAM: PORTABLE CHEST 1 VIEW COMPARISON:  Chest radiograph from one day prior. FINDINGS: Right internal jugular central venous catheter terminates at the cavoatrial junction. Stable cardiomediastinal silhouette with normal heart size. Right basilar chest tube in place. Stable small basilar right pneumothorax. Falling loss in the right hemithorax is unchanged. Diffuse right pleural thickening is stable. Patchy right lung base consolidation is unchanged. Multiple small scattered vaguely nodular opacities in the left lung. IMPRESSION: 1. Small right basilar pneumothorax with right chest tube in place, stable. 2. Stable diffuse right pleural thickening with volume loss in the right hemithorax. Stable patchy right lung base consolidation. 3. Stable scattered small vaguely nodular opacities in the left lung, see 01/06/2019 chest CT report for details. Electronically Signed   By: Ilona Sorrel M.D.   On: 01/09/2019 09:54   Dg Chest Port 1 View  Result Date: 01/08/2019 CLINICAL DATA:  Post right VATS. EXAM: PORTABLE CHEST 1 VIEW COMPARISON:  CT 01/06/2019.  FINDINGS: Right IJ line noted with tip  over superior vena cava. Right chest tube noted with tip over right lower chest. Right base pneumothorax noted. Right base atelectasis. Multiple pulmonary nodules are again noted. Heart size stable no acute bony abnormality. IMPRESSION: 1. Right chest tube noted with tip over right lower chest. right base pneumothorax noted on today's exam. 2.  Right IJ line noted with tip over superior vena cava. 3. Right base atelectasis. Multiple pulmonary nodules are again noted. Critical Value/emergent results were called by telephone at the time of interpretation on 01/08/2019 at 10:08 am to nurse Sharyn Lull, who verbally acknowledged these results. Electronically Signed   By: Marcello Moores  Register   On: 01/08/2019 10:11     Results/Tests Pending at Time of Discharge: Immunohistochemistry of the lung biopsy  Discharge Medications:  Allergies as of 01/12/2019   No Known Allergies     Medication List    TAKE these medications   HYDROcodone-acetaminophen 5-325 MG tablet Commonly known as: NORCO/VICODIN Take 2 tablets by mouth every 6 (six) hours for 7 days.   ibuprofen 600 MG tablet Commonly known as: ADVIL Take 1 tablet (600 mg total) by mouth every 6 (six) hours.   metFORMIN 750 MG 24 hr tablet Commonly known as: GLUCOPHAGE-XR Take 750 mg by mouth daily with breakfast.   multivitamin with minerals Tabs tablet Take 1 tablet by mouth daily.   pantoprazole 40 MG tablet Commonly known as: PROTONIX Take 40 mg by mouth daily.   polyethylene glycol 17 g packet Commonly known as: MIRALAX / GLYCOLAX Take 17 g by mouth daily.   senna-docusate 8.6-50 MG tablet Commonly known as: Senokot-S Take 1 tablet by mouth at bedtime.   zolpidem 5 MG tablet Commonly known as: AMBIEN Take 1 tablet (5 mg total) by mouth at bedtime as needed and may repeat dose one time if needed for sleep.            Durable Medical Equipment  (From admission, onward)         Start      Ordered   01/12/19 1056  For home use only DME oxygen  Once    Question Answer Comment  Length of Need 12 Months   Oxygen delivery system Gas      01/12/19 1055          Discharge Instructions: Please refer to Patient Instructions section of EMR for full details.  Patient was counseled important signs and symptoms that should prompt return to medical care, changes in medications, dietary instructions, activity restrictions, and follow up appointments.   Follow-Up Appointments: Follow-up Information    [name], MD. Go on 02/02/2019.   Specialty: Cardiothoracic Surgery Why: PA/LAT CXR to be taken (at [facility] which is in the same building as Dr. Leonarda Salon office) on 09/01 at 12:15 pm;Appointment time is at 12:45 pm. Please bring record of pleur x drainage results Contact information: [place] [address] [phone]        Tish Men, MD. Go on 01/19/2019.   Specialties: Hematology, Oncology Why: Your appointment is scheduled for 2:30pm. Please arrive 15 mintues early.  Contact information: [place] [address] [phone]           Gifford Shave, MD 01/12/2019, 1:30 PM PGY-1, [place]

## 2019-01-10 NOTE — Consult Note (Signed)
Consultation Note Date: 01/10/2019   Patient Name: Andrew Davenport  DOB: Jul 08, 1955  MRN: [id number]  Age / Sex: 63 y.o., male  PCP: Patient, No Pcp Per Referring Physician: [name], MD  Reason for Consultation: Establishing goals of care and Psychosocial/spiritual support  HPI/Patient Profile: 63 y.o. male with past medical history of GERD and NSCLCA who was admitted on 01/06/2019 with right sided chest pain and progressive SOB secondary to recurrent malignant pleural effusion.   He underwent VATS and pleural biopsy.  Oncology is following.  PMT was consulted for [place].  Clinical Assessment and Goals of Care  I have reviewed medical records including EPIC notes, labs and imaging, received report from Dr. Maudie Mercury, assessed the patient and spoke with him  to discuss diagnosis prognosis, [place], EOL wishes, disposition and options.  I introduced Palliative Medicine as specialized medical care for people living with serious illness. It focuses on providing relief from the symptoms and stress of a serious illness. The goal is to improve quality of life for both the patient and the family.  We discussed a brief life review of the patient. He is originally from [place] and was a 3 sport athlete at [employer]  He married had two children and divorced after 15 years.  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  He drove long haul trucks for many years. Between the two careers he has traveled the world.  He currently lives with his sister and mother.  [name] and his sister alternate caring for their 46 yo mother.  As far as functional and nutritional status Gregoire is fully functional and independent with all ADLs.  His only complaint is being SOB.  Edvardo understands he has a terminal illness.  He is set on maintaining the best quality of life.  He is not interested in chemotherapy if it will deteriorate  his quality of life.    We discussed a living will - [name] has chosen his daughter [name] to be his HCPOA.  He completed the paper work in my presence.  If he were to arrest he does not want resuscitation.  He initialed that he does not want life support in the event of having permanently lost consciousness, lost the ability to think, or if death is coming in a short time period.  He does not want to be an organ donor.  He does not want a feeding tube if he is near end of life.    Hospice and Palliative Care services outpatient were explained and offered.  [place] would benefit from outpatient follow up.  Questions and concerns were addressed.  Patient was encouraged to call with questions or concerns.    Primary Decision Maker:  PATIENT    SUMMARY OF RECOMMENDATIONS    Code status changed to DNR His focus is on Quality of life over Quantity of life. Advanced directive completed.  Chaplain order placed for Notary. He will need home health to help with pleural catheter. He would benefit  from Palliative Care out patient follow up. Please ensure he has a Gold DNR form on discharge.  Code Status/Advance Care Planning:  DNR   Symptom Management:   Pleurx drainage system.  Additional Recommendations (Limitations, Scope, Preferences):  Full Scope Treatment  Palliative Prophylaxis:   Frequent Pain Assessment  Psycho-social/Spiritual:   Desire for further Chaplaincy support:  He is spiritual but not "religious"  Prognosis:   Unable to determine.  Discharge Planning: Home with Home Health and Palliative Care      Primary Diagnoses: Present on Admission: . Pleural effusion   I have reviewed the medical record, interviewed the patient and family, and examined the patient. The following aspects are pertinent.  Past Medical History:  Diagnosis Date  . GERD (gastroesophageal reflux disease)    Social History   Socioeconomic History  . Marital status: Single     Spouse name: Not on file  . Number of children: Not on file  . Years of education: Not on file  . Highest education level: Not on file  Occupational History  . Not on file  Social Needs  . Financial resource strain: Patient refused  . Food insecurity    Worry: Patient refused    Inability: Patient refused  . Transportation needs    Medical: Patient refused    Non-medical: Patient refused  Tobacco Use  . Smoking status: Current Every Day Smoker    Packs/day: 0.25    Years: 43.00    Pack years: 10.75  . Smokeless tobacco: Never Used  Substance and Sexual Activity  . Alcohol use: No    Alcohol/week: 0.0 standard drinks  . Drug use: No  . Sexual activity: Not Currently  Lifestyle  . Physical activity    Days per week: Patient refused    Minutes per session: Patient refused  . Stress: Patient refused  Relationships  . Social Herbalist on phone: Patient refused    Gets together: Patient refused    Attends religious service: Patient refused    Active member of club or organization: Patient refused    Attends meetings of clubs or organizations: Patient refused    Relationship status: Patient refused  Other Topics Concern  . Not on file  Social History Narrative  . Not on file   History reviewed. No pertinent family history. Scheduled Meds: . bisacodyl  10 mg Oral Daily  . enoxaparin (LOVENOX) injection  40 mg Subcutaneous Q24H  . feeding supplement  1 Container Oral TID BM  . feeding supplement (ENSURE ENLIVE)  237 mL Oral BID BM  . HYDROcodone-acetaminophen  2 tablet Oral Q4H  . ibuprofen  600 mg Oral Q6H  . insulin aspart  0-9 Units Subcutaneous TID WC  .  morphine injection  2 mg Intravenous Once  . pantoprazole  40 mg Oral Daily  . polyethylene glycol  17 g Oral Daily  . senna-docusate  1 tablet Oral QHS   Continuous Infusions: . sodium chloride 100 mL/hr at 01/10/19 0241   PRN Meds:.alum & mag hydroxide-simeth, Glycerin (Adult), LORazepam,  ondansetron (ZOFRAN) IV, traMADol No Known Allergies Review of Systems denies pain currently.  Is complaining about SOB.  Denies constipation, dysuria, anorexia.  Physical Exam  Well developed male, awake, alert, coherent, cooperative.  Vital Signs: BP 122/83 (BP Location: Left Arm)   Pulse 94   Temp 98.3 F (36.8 C) (Oral)   Resp 19   Ht 6\' 1"  (1.854 m)   Wt 114.9 kg   SpO2  98%   BMI 33.42 kg/m  Pain Scale: 0-10 POSS *See Group Information*: 1-Acceptable,Awake and alert Pain Score: 0-No pain   SpO2: SpO2: 98 % O2 Device:SpO2: 98 % O2 Flow Rate: .O2 Flow Rate (L/min): 2 L/min  IO: Intake/output summary:   Intake/Output Summary (Last 24 hours) at 01/10/2019 1502 Last data filed at 01/10/2019 1300 Gross per 24 hour  Intake 2411.41 ml  Output 1540 ml  Net 871.41 ml    LBM: Last BM Date: 01/05/19 Baseline Weight: Weight: 110.7 kg Most recent weight: Weight: 114.9 kg     Palliative Assessment/Data: 70%     Time In: 2:00 Time Out: 3:30 Time Total: 90 min. Visit consisted of counseling and education dealing with the complex and emotionally intense issues surrounding the need for palliative care and symptom management in the setting of serious and potentially life-threatening illness. Greater than 50%  of this time was spent counseling and coordinating care related to the above assessment and plan.  Signed by: Florentina Jenny, PA-C Palliative Medicine Pager: [phone]  Please contact Palliative Medicine Team phone at [phone] for questions and concerns.  For individual provider: See Shea Evans

## 2019-01-10 NOTE — Progress Notes (Signed)
Called to room by pt.  Pt  C/o feeling full and needing to drain pleurax .  Assisted pt.  Pt unable to drain any fluid out of Pleura x.   Assisted pt with new  dressing.  Pt stated " feels little better since dressing changed"  Will continue to monitor. Saunders Revel T

## 2019-01-10 NOTE — Progress Notes (Addendum)
Went to bedside to see patient as I received report this morning from Dr. Tarry Kos patient was feeling increased pressure in his lungs.  Repeat chest x-ray from this morning shows worsening white out on his right base.  Patient also feels short of breath at this time.  Spoke to CVTS who recommends that patient be hooked up to Pleurx vacuum sealed containers which patient has at bedside and is able to do on his own.  Pleur-evac requires an adapter that is in the OR only.  Will discontinue that order.  Have placed nurse care instructions for connection to Pleurx container.   Patient's main complaints at this time are hunger and some worsening shortness of breath.  Patient was not on nasal cannula and satting well on room air, though he did have mild increased work of breathing with accessory muscle use.  He has a good plan in place for MRI today.  He would like improved shortness of breath prior to going down for MRI and reports that he will be getting anxiety medication and will be sedated.  Patient was irritable at beginning of encounter but cooperative and very pleasant by the end of the encounter. Will continue to monitor patient and breathing status.  Wilber Oliphant, M.D.  PGY-2  Family Medicine  [phone] 01/10/2019 3:25 PM

## 2019-01-11 DIAGNOSIS — J90 Pleural effusion, not elsewhere classified: Secondary | ICD-10-CM

## 2019-01-11 DIAGNOSIS — E871 Hypo-osmolality and hyponatremia: Secondary | ICD-10-CM

## 2019-01-11 DIAGNOSIS — C3490 Malignant neoplasm of unspecified part of unspecified bronchus or lung: Secondary | ICD-10-CM

## 2019-01-11 DIAGNOSIS — Z7189 Other specified counseling: Secondary | ICD-10-CM

## 2019-01-11 LAB — BASIC METABOLIC PANEL
Anion gap: 10 (ref 5–15)
BUN: 10 mg/dL (ref 6–20)
CO2: 24 mmol/L (ref 22–32)
Calcium: 8.8 mg/dL — ABNORMAL LOW (ref 8.9–10.3)
Chloride: 97 mmol/L — ABNORMAL LOW (ref 98–111)
Creatinine, Ser: 0.69 mg/dL (ref 0.61–1.24)
GFR calc Af Amer: >60 mL/min (ref >60)
GFR calc non Af Amer: >60 mL/min (ref >60)
Glucose, Bld: 159 mg/dL — ABNORMAL HIGH (ref 70–99)
Potassium: 4.3 mmol/L (ref 3.5–5.1)
Sodium: 131 mmol/L — ABNORMAL LOW (ref 135–145)

## 2019-01-11 LAB — GLUCOSE, CAPILLARY
Glucose-Capillary: 98 mg/dL (ref 70–99)
Glucose-Capillary: 128 mg/dL — ABNORMAL HIGH (ref 70–99)
Glucose-Capillary: 125 mg/dL — ABNORMAL HIGH (ref 70–99)
Glucose-Capillary: 158 mg/dL — ABNORMAL HIGH (ref 70–99)

## 2019-01-11 LAB — CBC
HCT: 32.6 % — ABNORMAL LOW (ref 39.0–52.0)
Hemoglobin: 10.6 g/dL — ABNORMAL LOW (ref 13.0–17.0)
MCH: 27.8 pg (ref 26.0–34.0)
MCHC: 32.5 g/dL (ref 30.0–36.0)
MCV: 85.6 fL (ref 80.0–100.0)
Platelets: 371 10*3/uL (ref 150–400)
RBC: 3.81 MIL/uL — ABNORMAL LOW (ref 4.22–5.81)
RDW: 14.4 % (ref 11.5–15.5)
WBC: 11.6 10*3/uL — ABNORMAL HIGH (ref 4.0–10.5)
nRBC: 0 % (ref 0.0–0.2)

## 2019-01-11 LAB — MRSA PCR SCREENING: MRSA by PCR: NEGATIVE

## 2019-01-11 MED ORDER — ZOLPIDEM TARTRATE 5 MG PO TABS: 5.0000 mg | ORAL_TABLET | Freq: Every evening | ORAL | Status: DC | PRN

## 2019-01-11 NOTE — Progress Notes (Addendum)
Patient has Pleural -X drain on Rt. Chest. Pleural - X drain was different cap (Aspira pleural drainage system). Patient was cleaning with alcohol sponge then recapped after drainage today (279ml). PA Tacy Dura made aware of this matter and she will check it tomorrow morning. [employer]

## 2019-01-11 NOTE — Progress Notes (Signed)
      [address]       [place] [address]             [phone]       3 Days Post-Op Procedure(s) (LRB): VIDEO ASSISTED THORACOSCOPY (Right) PLEURAL BIOPSY (Right)  Subjective: Patient states breathing is better.  Objective: Vital signs in last 24 hours: Temp:  [97.8 F (36.6 C)-98.4 F (36.9 C)] 97.8 F (36.6 C) (08/10 0304) Pulse Rate:  [92-118] 92 (08/10 0304) Cardiac Rhythm: Sinus tachycardia (08/09 1900) Resp:  [18-22] 18 (08/10 0304) BP: (119-136)/(82-93) 119/84 (08/10 0304) SpO2:  [98 %] 98 % (08/10 0304)      Intake/Output from previous day: 08/09 0701 - 08/10 0700 In: 800 [P.O.:800] Out: 1200 [Urine:850; Chest Tube:350]   Physical Exam:  Cardiovascular: RRR Pulmonary: Clear to auscultation on left and diminished right basilar breath sounds. Abdomen: Soft, non tender, bowel sounds present. Extremities: Trace bilateral lower extremity edema. Wounds: Clean and dry.  No erythema or signs of infection.   Lab Results: CBC: Recent Labs    01/10/19 0222 01/11/19 0240  WBC 14.2* 11.6*  HGB 11.4* 10.6*  HCT 35.8* 32.6*  PLT 431* 371   BMET:  Recent Labs    01/10/19 0222 01/11/19 0240  NA 133* 131*  K 4.1 4.3  CL 95* 97*  CO2 24 24  GLUCOSE 151* 159*  BUN 11 10  CREATININE 0.85 0.69  CALCIUM 9.1 8.8*    PT/INR: No results for input(s): LABPROT, INR in the last 72 hours. ABG:  INR: Will add last result for INR, ABG once components are confirmed Will add last 4 CBG results once components are confirmed  Assessment/Plan:  1. CV - SR in the 90's. 2.  Pulmonary - Pleur X drained yesterday and 350 cc removed. Continue daily Pleur X drainage. On 2 liters of oxygen via [place]. Await final pathology. MRI of brain ordered. 3. Anemia-H and H this am 10.6 and 32.6 4. Per medical service  Tadd Holtmeyer M ZimmermanPA-C 01/11/2019,7:32 AM [phone]

## 2019-01-11 NOTE — Progress Notes (Signed)
Andrew Davenport   DOB:[birth year]   PJ#:[id number]    HEME/ONC OVERVIEW: 1. 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 -I reviewed the patient's VATS procedure note, which showed extensive pleural involvement; the preliminary bx showed adenocarcinoma, but the final report is pending -I also independently reviewed the radiologic images of recent CT abdomen/pelvis, and agreed with the findings as documented; in summary, CT AP did not show any definite evidence of metastatic disease -Patient is scheduled to have MRI brain done tomorrow -Once the pathology report confirms adenocarcinoma, we will request additional molecular studies and PD-L1 stain to determine the treatment options   Malignant right pleural effusion -I discussed with the patient that he had some trapped lung on recent VATS, and will likely have recurrent fluid accumulation due to incomplete expansion of the lung -Continue daily to q2day drainage -Pls ensure that the patient has sufficient supplies at home prior to discharge; otherwise, he will be at high risk for re-admission  Hyponatremia -Most likely secondary to decreased PO intake -Na 131, stable; patient is asymptomatic -I recommend nutritional consult prior to discharge to optimize nutrition  Normocytic anemia -Most likely secondary to anemia of chronic disease -Patient denies any symptoms of bleeding -Continue to monitor it for now  Goals of care discussion -Patient understands that his cancer is NOT curable, but treatable; his treatment options are not yet  established until we have the results from his molecular studies -Overall, he appears to be a good candidate for treatment, and is hopeful that treatment can give him some more quality time with his family, including his grandchildren  Thank you for the consult. Patient is currently scheduled with oncology in clinic on 01/19/2019. Please do not hesitate to contact us if there are any questions.   Tish Men, MD 01/11/2019  3:15 PM   Subjective:  [name] reports that he just drained ~372m of the pleural fluid this morning with improvement in his breathing. His appetite is improving, and he has been able to maintain reasonable bowel movement without recurrent constipation. He also reports that the nausea has resolved. He denies any other complaint this afternoon.   ROS: Constitutional: ( - ) fevers, ( - )  chills , ( - ) night sweats Ears, nose, mouth, throat, and face: ( - ) mucositis, ( - ) sore throat Respiratory: ( - ) cough, ( - ) dyspnea, ( - ) wheezes Cardiovascular: ( - ) palpitation, ( - ) chest discomfort, ( - ) lower extremity swelling Gastrointestinal:  ( - ) nausea, ( - ) heartburn, ( - ) change in bowel habits Skin: ( - ) abnormal skin rashes Behavioral/Psych: ( - ) mood change, ( - ) new changes  All other systems were reviewed with the patient and are negative.  Objective:  Vitals:   01/11/19 1047 01/11/19 1200  BP: 112/79   Pulse: 80   Resp: 11 13  Temp: 98.6 F (37 C)   SpO2: 100%      Intake/Output Summary (Last 24 hours) at 01/11/2019 1515 Last data filed at 01/11/2019 1200 Gross per 24 hour  Intake 1040 ml  Output 750 ml  Net  290 ml    GENERAL: alert, no distress and comfortable SKIN: skin color, texture, turgor are normal, no rashes or significant lesions EYES: conjunctiva are pink and non-injected, sclera clear OROPHARYNX: no exudate, no erythema; lips, buccal mucosa, and tongue normal  NECK: supple, non-tender LUNGS: clear to auscultation with  normal breathing effort HEART: regular rate & rhythm and no murmurs and no lower extremity edema ABDOMEN: soft, non-tender, non-distended, normal bowel sounds PSYCH: alert & oriented x 3, fluent speech NEURO: no focal motor/sensory deficits   Labs:  Lab Results  Component Value Date   WBC 11.6 (H) 01/11/2019   HGB 10.6 (L) 01/11/2019   HCT 32.6 (L) 01/11/2019   MCV 85.6 01/11/2019   PLT 371 01/11/2019   NEUTROABS 7.0 01/06/2019    Lab Results  Component Value Date   NA 131 (L) 01/11/2019   K 4.3 01/11/2019   CL 97 (L) 01/11/2019   CO2 24 01/11/2019    Studies:  Dg Chest Port 1 View  Result Date: 01/10/2019 CLINICAL DATA:  63 year old male with history of pleural effusion. EXAM: PORTABLE CHEST 1 VIEW COMPARISON:  Chest x-ray 01/09/2019. FINDINGS: Right-sided chest tube in position with tip projecting over the lower right hemithorax. Moderate right pleural effusion again noted. No appreciable right pneumothorax confidently identified on today's examination. Irregular opacities throughout the right mid to lower lung may reflect areas of atelectasis and/or consolidation. A few scattered ill-defined reticulonodular opacities are noted throughout the left mid to lower lung, corresponding to scattered areas of nodularity seen on prior CT the abdomen and pelvis 01/07/2019. Left lung is otherwise clear. No left pleural effusion. No evidence of pulmonary edema. Heart size is normal. IMPRESSION: 1. Read chemo elation of right-sided pleural fluid. Moderate pleural effusion noted at this time. No residual pneumothorax confidently identified. 2. Patchy ill-defined opacities throughout the lungs bilaterally (right greater than left). Based on correlation with prior chest CT, findings are favored to reflect metastatic disease with potential lymphangitic spread of tumor in the right lung. Underlying infectious consolidation is not entirely excluded, but is not strongly favored. Electronically Signed   By:  Vinnie Langton M.D.   On: 01/10/2019 09:41

## 2019-01-11 NOTE — Progress Notes (Signed)
This chaplain responded to PMT consult for notarizing of Pt. AD.   The chaplain understands the Pt. prefers to wait for signing of the document until tomorrow. The Pt. stated he wants to be available for MRI as needed today. The chaplain shared with the Pt. if time permits today, the Pt. can request a chaplain to coordinate a notary visit through the RN. This chaplain will refer spiritual care F/U for Tuesday.

## 2019-01-11 NOTE — Progress Notes (Signed)
Family Medicine Teaching Service Daily Progress Note Intern Pager: [phone]  Patient name: Andrew Davenport Medical record number: [id number] Date of birth: 10/19/1955 Age: 63 y.o. Gender: male  Primary Care Provider: Patient, No Pcp Per Consultants: IR, oncology, palliative, CVTS Code Status: Full  Pt Overview and Major Events to Date:  01/06/19 - Admitted to [place], CT chest: loculated R pleural fluid, multiple bilateral small pulm nodules, R hilar lymph node 01/07/19 - CT abd/pelvis: liver lesion suspect flash fill cavernous hemangioma 01/08/19 - VATS and pleural biopsy, PleurX catheter replaced   Assessment and Plan: [name] is a 63 y.o. male presenting with progressive SOB and right-sided chest pain. PMH is significant for small cell carcinoma, recurrent pleural effusions requiring thoracocentesis and type 2 diabetes.  Hypoxemic respiratory failure 2/2 adenocarcinoma/malignant pleural effusions POD 3 s/p Right VATS and pleural biopsy. Bandage clean and intact. Pain well managed with sch'd ibuprofen and 2xNorco 5-325 q4 with 1 dose of PRN tramadol with morning.  Pleurx was drained yesterday.  No crackles on exam. Satting well on 2L O2, moderately tachpyneic overnight (RR 18-22). Pleuravac suction: 350 MLs removed from suction yesterday. Unable to perform MRI yesterday given anxiety.  Patient asking if MRI is completely necessary.  Tissue culture NGTD.  -CVTS-appreciate recs             -Pleurex  -Oncology-appreciate recs            -MRI brain with contrast-ordered, pending             -Palliative consult appreciated -Continue to monitor and document Pleurx fluid amount and color -Continue to monitor vitals -Continue pain regiment  - IV Ativan 2mg  x 1 prior to MRI -Chest x-ray yesterday showed mild pleural effusion.  Drained via Pleurx  Pain 2/2 to malignancy/pleural effusions PCA discontinued. Pain well controlled on scheduled PO ibuprofen and Norco with PRN tramadol. Required 1  dose of tramadol overnight.  - Continue Norco 5-325 two PO q4 hours - Continue Tramadol 50-100mg  q6 PRN  - Ibuprofen 600 mg Q6Hrly  - Monitor pain   Type 2 diabetes mellitus, well-controlled  Recent diagnosis. A1C (12/21/18) 7.4.  Home meds: Metformin 750mg  QD. CBG's 117-167 over last 24 hours.  -Continue to monitor CBGs -sSSI  Hyponatremia: stable Na 129>133>133>131. 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 No signs of infection.  -Continue to monitor  GERD: Home meds: Protonix 40mg  QD -continue home  meds  FEN/GI: Heart healthy diet, Protonix PPx: Lovenox  Disposition: Management of pleural effusions  Subjective:  Patient reports she is doing "100% better than yesterday ".  He reports that he can breathe better and attributes that to his Pleurx being drained yesterday.  It is only 2 L nasal cannula at this time reports that he still having incision site pain around the Pleurx tube that radiates down and around his back which is consistent with his previous pains.  Reports that he feels like he is getting back to normal and says he had a large bowel movement this morning which he has not had in a while.  Questions if the MRI is truly necessary still has concerns about it.  Objective: Temp:  [97.8 F (36.6 C)-98.4 F (36.9 C)] 97.8 F (36.6 C) (08/10 0304) Pulse Rate:  [92-118] 92 (08/10 0304) Resp:  [18-22] 18 (08/10 0304) BP: (119-136)/(79-93) 119/84 (08/10 0304) SpO2:  [97 %-98 %] 98 % (08/10  Delius.Fowler)   Physical Exam: General: Alert and cooperative and appears to be in no acute distress HEENT: Atraumatic normocephalic Cardio: Normal S1 and S2, no S3 or S4. Rhythm is regular. No murmurs or rubs.   Pulm: Clear to auscultation bilaterally, no crackles, wheezing, or diminished breath sounds.  On 2 L O2.  Incision site healing well with no erythema.   Dressings applied are clean and dry. Abdomen: Bowel sounds normal. Abdomen soft and non-tender.  Extremities: Mild lower extremity edema bilaterally warm/ well perfused.   Neuro: Cranial nerves grossly intact  Laboratory: Recent Labs  Lab 01/09/19 0354 01/10/19 0222 01/11/19 0240  WBC 16.5* 14.2* 11.6*  HGB 10.7* 11.4* 10.6*  HCT 32.9* 35.8* 32.6*  PLT 404* 431* 371   Recent Labs  Lab 01/07/19 0617 01/07/19 2242 01/09/19 0354 01/10/19 0222 01/11/19 0240  NA 130* 129* 133* 133* 131*  K 3.7 3.9 4.3 4.1 4.3  CL 90* 89* 94* 95* 97*  CO2 24 27 26 24 24   BUN 9 14 6 11 10   CREATININE 0.92 1.03 0.73 0.85 0.69  CALCIUM 9.2 9.0 9.0 9.1 8.8*  PROT 6.8 6.6  --  6.3*  --   BILITOT 0.4 0.2*  --  0.3  --   ALKPHOS 78 96  --  83  --   ALT 37 35  --  29  --   AST 32 27  --  27  --   GLUCOSE 116* 163* 141* 151* 159*    Imaging/Diagnostic Tests: Dg Chest Port 1 View  Result Date: 01/10/2019 CLINICAL DATA:  63 year old male with history of pleural effusion. EXAM: PORTABLE CHEST 1 VIEW COMPARISON:  Chest x-ray 01/09/2019. FINDINGS: Right-sided chest tube in position with tip projecting over the lower right hemithorax. Moderate right pleural effusion again noted. No appreciable right pneumothorax confidently identified on today's examination. Irregular opacities throughout the right mid to lower lung may reflect areas of atelectasis and/or consolidation. A few scattered ill-defined reticulonodular opacities are noted throughout the left mid to lower lung, corresponding to scattered areas of nodularity seen on prior CT the abdomen and pelvis 01/07/2019. Left lung is otherwise clear. No left pleural effusion. No evidence of pulmonary edema. Heart size is normal. IMPRESSION: 1. Read chemo elation of right-sided pleural fluid. Moderate pleural effusion noted at this time. No residual pneumothorax confidently identified. 2. Patchy ill-defined opacities throughout the lungs bilaterally (right greater  than left). Based on correlation with prior chest CT, findings are favored to reflect metastatic disease with potential lymphangitic spread of tumor in the right lung. Underlying infectious consolidation is not entirely excluded, but is not strongly favored. Electronically Signed   By: Vinnie Langton M.D.   On: 01/10/2019 09:41   Dg Chest Port 1 View  Result Date: 01/09/2019 CLINICAL DATA:  Lung cancer, post VATS EXAM: PORTABLE CHEST 1 VIEW COMPARISON:  Chest radiograph from one day prior. FINDINGS: Right internal jugular central venous catheter terminates at the cavoatrial junction. Stable cardiomediastinal silhouette with normal heart size. Right basilar chest tube in place. Stable small basilar right pneumothorax. Falling loss in the right hemithorax is unchanged. Diffuse right pleural thickening is stable. Patchy right lung base consolidation is unchanged. Multiple small scattered vaguely nodular opacities in the left lung. IMPRESSION: 1. Small right basilar pneumothorax with right chest tube in place, stable. 2. Stable diffuse right pleural thickening with volume loss in the right hemithorax. Stable patchy right lung base consolidation. 3. Stable scattered small vaguely nodular opacities in the left  lung, see 01/06/2019 chest CT report for details. Electronically Signed   By: Ilona Sorrel M.D.   On: 01/09/2019 09:54    Gifford Shave, MD 01/11/2019, 5:57 AM PGY-1, [place] Intern pager: [phone], text pages welcome

## 2019-01-11 NOTE — Progress Notes (Signed)
NCM received call from April with [facility] stating patient's PCP is Dr. [place], and the CSW is Horald Chestnut -  His pager is [phone] phone is [phone] ext [phone].

## 2019-01-12 ENCOUNTER — Encounter: Payer: Self-pay | Admitting: *Deleted

## 2019-01-12 ENCOUNTER — Inpatient Hospital Stay (HOSPITAL_COMMUNITY): Payer: No Typology Code available for payment source

## 2019-01-12 LAB — GLUCOSE, CAPILLARY
Glucose-Capillary: 114 mg/dL — ABNORMAL HIGH (ref 70–99)
Glucose-Capillary: 116 mg/dL — ABNORMAL HIGH (ref 70–99)

## 2019-01-12 MED ORDER — POLYETHYLENE GLYCOL 3350 17 G PO PACK: 17.0000 g | PACK | Freq: Every day | ORAL | 0 refills | Status: AC

## 2019-01-12 MED ORDER — ZOLPIDEM TARTRATE 5 MG PO TABS: 5.0000 mg | ORAL_TABLET | Freq: Every evening | ORAL | 0 refills | Status: DC | PRN

## 2019-01-12 MED ORDER — ADULT MULTIVITAMIN W/MINERALS CH: 1.0000 | ORAL_TABLET | Freq: Every day | ORAL | 0 refills | Status: AC

## 2019-01-12 MED ORDER — IBUPROFEN 600 MG PO TABS: 600.0000 mg | ORAL_TABLET | Freq: Four times a day (QID) | ORAL | 0 refills | Status: DC

## 2019-01-12 MED ORDER — ADULT MULTIVITAMIN W/MINERALS CH
1.0000 | ORAL_TABLET | Freq: Every day | ORAL | Status: DC
  Administered 2019-01-12 – 2019-01-13 (×2): 1 via ORAL
  Filled 2019-01-12 (×2): qty 1

## 2019-01-12 MED ORDER — SENNOSIDES-DOCUSATE SODIUM 8.6-50 MG PO TABS: 1.0000 | ORAL_TABLET | Freq: Every day | ORAL | 0 refills | Status: AC

## 2019-01-12 MED ORDER — GADOBUTROL 1 MMOL/ML IV SOLN
10.0000 mL | Freq: Once | INTRAVENOUS | Status: AC | PRN
  Administered 2019-01-12: 10 mL via INTRAVENOUS

## 2019-01-12 MED ORDER — HYDROCODONE-ACETAMINOPHEN 5-325 MG PO TABS: 2.0000 | ORAL_TABLET | Freq: Four times a day (QID) | ORAL | 0 refills | Status: DC

## 2019-01-12 NOTE — Progress Notes (Addendum)
[address]       [place] [address]             [phone]       4 Days Post-Op Procedure(s) (LRB): VIDEO ASSISTED THORACOSCOPY (Right) PLEURAL BIOPSY (Right)  Subjective: Patient states breathing is pretty good.  Objective: Vital signs in last 24 hours: Temp:  [97.8 F (36.6 C)-98.6 F (37 C)] 98.4 F (36.9 C) (08/11 0446) Pulse Rate:  [80-88] 88 (08/10 1639) Cardiac Rhythm: Sinus tachycardia (08/11 0701) Resp:  [11-28] 28 (08/11 0446) BP: (112-135)/(63-89) 135/89 (08/11 0446) SpO2:  [100 %] 100 % (08/10 1639)     Intake/Output from previous day: 08/10 0701 - 08/11 0700 In: 1000 [P.O.:1000] Out: 250 [Chest Tube:250]   Physical Exam:  Cardiovascular: RRR Pulmonary: Clear to auscultation on left and diminished right basilar breath sounds. Abdomen: Soft, non tender, bowel sounds present. Extremities: Trace bilateral lower extremity edema. Wounds: Clean and dry.  No erythema or signs of infection.   Lab Results: CBC: Recent Labs    01/10/19 0222 01/11/19 0240  WBC 14.2* 11.6*  HGB 11.4* 10.6*  HCT 35.8* 32.6*  PLT 431* 371   BMET:  Recent Labs    01/10/19 0222 01/11/19 0240  NA 133* 131*  K 4.1 4.3  CL 95* 97*  CO2 24 24  GLUCOSE 151* 159*  BUN 11 10  CREATININE 0.85 0.69  CALCIUM 9.1 8.8*    PT/INR: No results for input(s): LABPROT, INR in the last 72 hours. ABG:  INR: Will add last result for INR, ABG once components are confirmed Will add last 4 CBG results once components are confirmed  Assessment/Plan:  1. CV - SR in the 90's. 2.  Pulmonary - Pleur X drained yesterday and 250 cc removed. Continue daily Pleur X drainage. On 2 liters of oxygen via [place]. Final pathology. 1. Pleura, biopsy, Right - ADENOCARCINOMA. 2. Pleura, peel, Right - MALIGNANT CELLS CONSISTENT WITH NON-SMALL CELL CARCINOMA AND ABUNDANT FIBRINOUS EXUDATE. 3. Pleura, biopsy, Right - ADENOCARCINOMA. 3. Anemia-H and H this am 10.6 and 32.6 4.  Per medical service;follow up arranged with TCTS  Donielle M ZimmermanPA-C 01/12/2019,8:08 AM [phone]  Patient seen and examined, agree with above Path showed adenocarcinoma. I did request pathology send the tissue for Foundatin one and PDL1 testing- those results will take about 2 weeks to come back Continue daily pleural catheter drainage Ok for dc from a surgical perspective I will see him back in 2-3 weeks to check incisions  Remo Lipps C. Roxan Hockey, MD [facility] [phone]

## 2019-01-12 NOTE — Progress Notes (Signed)
Initial Nutrition Assessment  RD working remotely.  DOCUMENTATION CODES:   Obesity unspecified  INTERVENTION:   - Recommend liberalizing diet to Regular  - Continue Ensure Enlive po BID, each supplement provides 350 kcal and 20 grams of protein  - Add MVI with minerals daily  - RD attached "High Calorie, High Protein Nutrition Therapy" handout from the Academy of [employer] and Dietetics to pt's discharge instructions  - d/c Boost Breeze  NUTRITION DIAGNOSIS:   Increased nutrient needs related to cancer and cancer related treatments as evidenced by estimated needs.  GOAL:   Patient will meet greater than or equal to 90% of their needs  MONITOR:   PO intake, Supplement acceptance, Skin, Labs, Weight trends  REASON FOR ASSESSMENT:   Consult Assessment of nutrition requirement/status  ASSESSMENT:   63 year old male who presented to the ED on 8/05 with chest pain, SOB, and back pain. PMH of tobacco use, T2DM, GERD, recent hospital admission at [facility] (7/19-7/30) for right pleural effusion/hemothorax, pneumonia s/p several thoracentesis, and placement of pleural drainage tube. Pleural taps from previous hospital show stage IV adenocarcinoma of the lung.   8/07 - s/p VATS and pleural biopsy, Pleurex catheter replaced  Weight history in chart is limited. Last weight available from PTA is from 2017. Per review of notes, pt has reported a 30-40 lb weight loss over the last several months. Unable to confirm this in chart.  Unable to reach pt via phone call to room. Noted pt refusing most Boost Breeze so RD will d/c that order. Pt accepting ~50% of Ensure Enlive per MAR. RD will also add daily MVI.  RD will attach "High Calorie, High Protein Nutrition Therapy" handout from the Academy of Nutrition [employer] Dietetics to pt's discharge instructions given reported unintentional weight loss.  Meal Completion: 25-100% x last 8 recorded meals (averaging 83%)  Medications reviewed and  include: Dulcolax, Boost Breeze TID, Ensure Enlive BID, SSI, Protonix, Miralax, Senna  Labs reviewed: sodium 131 CBG's: 114-158 x 24 hours  CT: 250 ml x 24 hours I/O's: +1.9 L since admit  NUTRITION - FOCUSED PHYSICAL EXAM:  Unable to complete at this time. RD working remotely.  Diet Order:   Diet Order            Diet Heart Room service appropriate? Yes; Fluid consistency: Thin  Diet effective now              EDUCATION NEEDS:   Education needs have been addressed  Skin:  Skin Assessment: Skin Integrity Issues: Incisions: closed incision to right chest  Last BM:  01/11/19  Height:   Ht Readings from Last 1 Encounters:  01/06/19 6\' 1"  (1.854 m)    Weight:   Wt Readings from Last 1 Encounters:  01/10/19 114.9 kg    Ideal Body Weight:  83.6 kg  BMI:  Body mass index is 33.42 kg/m.  Estimated Nutritional Needs:   Kcal:  5027-7412  Protein:  110-125 grams  Fluid:  >/= 2.0 L    Gaynell Face, MS, RD, LDN Inpatient Clinical Dietitian Pager: [phone] Weekend/After Hours: [phone]

## 2019-01-12 NOTE — Progress Notes (Addendum)
Family Medicine Teaching Service Daily Progress Note Intern Pager: [phone]  Patient name: Andrew Davenport Medical record number: [id number] Date of birth: 1956-01-16 Age: 63 y.o. Gender: male  Primary Care Provider: Patient, No Pcp Per Consultants: IR, oncology, palliative, CVTS Code Status: Full  Pt Overview and Major Events to Date:  01/06/19 - Admitted to [facility], CT chest: loculated R pleural fluid, multiple bilateral small pulm nodules, R hilar lymph node 01/07/19 - CT abd/pelvis: liver lesion suspect flash fill cavernous hemangioma 01/08/19 - VATS and pleural biopsy, PleurX catheter replaced   Assessment and Plan: Andrew Davenport is a 63 y.o. male presenting with progressive SOB and right-sided chest pain. PMH is significant for small cell carcinoma, recurrent pleural effusions requiring thoracocentesis and type 2 diabetes.  Hypoxemic respiratory failure 2/2 adenocarcinoma/malignant pleural effusions POD 3 s/p Right VATS and pleural biopsy. Bandage clean and intact. Pain well managed with sch'd ibuprofen and 2xNorco 5-325 q4.  He required no PRN tramadol overnight.  Patient is able to drain Pleurx and drained yesterday.  No crackles on exam. Satting well on 2L O2, moderately tachpyneic overnight (RR 18-22). Pleuravac suction: 350 MLs removed from suction yesterday. Unable to perform MRI yesterday given scheduling issues.  Patient planning to have MRI this morning.  tissue culture NGTD.  -CVTS-appreciate recs             -Pleurex with patient trained to self drain -Oncology-appreciate recs            -MRI brain with contrast-ordered, pending scheduled for this morning            -Palliative consult appreciated we will follow-up after has resulted -Continue to monitor and document Pleurx fluid amount and color -Continue to monitor vitals -Continue pain regiment  - IV Ativan 2mg  x 1 prior to MRI  -Chest x-ray yesterday showed mild pleural effusion.  Drained via Pleurx  Pain 2/2 to  malignancy/pleural effusions PCA discontinued. Pain well controlled on scheduled PO ibuprofen and Norco with PRN tramadol. Required no PRN tramadol overnight. - Continue Norco 5-325 two PO q4 hours - Continue Tramadol 50-100mg  q6 PRN  - Ibuprofen 600 mg Q6Hrly  - Monitor pain   Type 2 diabetes mellitus, well-controlled  Recent diagnosis. A1C (12/21/18) 7.4.  Home meds: Metformin 750mg  QD. CBG's 117-167 over last 24 hours.  -Continue to monitor CBGs -sSSI  Hyponatremia: stable Na 129>133>133>131. 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 No signs of infection.  -Continue to monitor  GERD: Home meds: Protonix 40mg  QD -continue home  meds  FEN/GI: Heart healthy diet, Protonix PPx: Lovenox  Disposition: Management of pleural effusions  Subjective:  Andrew Davenport is doing well this morning.  Reports that he slept well overnight although upon chart review he did not get his PRN Ambien.  Denies any new shortness of breath.  Drained about 250 mL from Pleurx yesterday.  MRI scheduled for today.  Objective: Temp:  [97.8 F (36.6 C)-98.6 F (37 C)] 98.4 F (36.9 C) (08/11 0446) Pulse Rate:  [80-98] 88 (08/10 1639) Resp:  [11-28] 28 (08/11 0446) BP: (112-135)/(63-89) 135/89 (08/11 0446) SpO2:  [100 %] 100 % (08/10 1639)   Physical Exam: General: Alert and cooperative and appears to be in no acute distress resting comfortably in bed HEENT: Atraumatic normocephalic Cardio: Normal S1 and S2, no S3 or S4. Rhythm is regular. No murmurs or rubs.  Pulm: Clear to auscultation bilaterally, no crackles, wheezing, or diminished breath sounds.  4L O2.  Incision site healing well with no erythema.  Dressings applied are clean and dry. Abdomen: Bowel sounds normal. Abdomen soft and non-tender.  Extremities: Mild lower extremity edema bilaterally warm/well  perfused Neuro: Cranial nerves grossly intact  Laboratory: Recent Labs  Lab 01/09/19 0354 01/10/19 0222 01/11/19 0240  WBC 16.5* 14.2* 11.6*  HGB 10.7* 11.4* 10.6*  HCT 32.9* 35.8* 32.6*  PLT 404* 431* 371   Recent Labs  Lab 01/07/19 0617 01/07/19 2242 01/09/19 0354 01/10/19 0222 01/11/19 0240  NA 130* 129* 133* 133* 131*  K 3.7 3.9 4.3 4.1 4.3  CL 90* 89* 94* 95* 97*  CO2 24 27 26 24 24   BUN 9 14 6 11 10   CREATININE 0.92 1.03 0.73 0.85 0.69  CALCIUM 9.2 9.0 9.0 9.1 8.8*  PROT 6.8 6.6  --  6.3*  --   BILITOT 0.4 0.2*  --  0.3  --   ALKPHOS 78 96  --  83  --   ALT 37 35  --  29  --   AST 32 27  --  27  --   GLUCOSE 116* 163* 141* 151* 159*   Blood glucose ranging from 98-158 Imaging/Diagnostic Tests: Dg Chest Port 1 View  Result Date: 01/10/2019 CLINICAL DATA:  63 year old male with history of pleural effusion. EXAM: PORTABLE CHEST 1 VIEW COMPARISON:  Chest x-ray 01/09/2019. FINDINGS: Right-sided chest tube in position with tip projecting over the lower right hemithorax. Moderate right pleural effusion again noted. No appreciable right pneumothorax confidently identified on today's examination. Irregular opacities throughout the right mid to lower lung may reflect areas of atelectasis and/or consolidation. A few scattered ill-defined reticulonodular opacities are noted throughout the left mid to lower lung, corresponding to scattered areas of nodularity seen on prior CT the abdomen and pelvis 01/07/2019. Left lung is otherwise clear. No left pleural effusion. No evidence of pulmonary edema. Heart size is normal. IMPRESSION: 1. Read chemo elation of right-sided pleural fluid. Moderate pleural effusion noted at this time. No residual pneumothorax confidently identified. 2. Patchy ill-defined opacities throughout the lungs bilaterally (right greater than left). Based on correlation with prior chest CT, findings are favored to reflect metastatic disease with potential lymphangitic  spread of tumor in the right lung. Underlying infectious consolidation is not entirely excluded, but is not strongly favored. Electronically Signed   By: Vinnie Langton M.D.   On: 01/10/2019 09:41    Gifford Shave, MD 01/12/2019, 6:01 AM PGY-1, [place] Intern pager: [phone], text pages welcome

## 2019-01-12 NOTE — Progress Notes (Signed)
Pt is refusing telemetry at this time stating " I'm not supposed to be here so I'm not putting it on." RN notified central telemetry and placed pt on standby. Pts VS stable. Will continue to monitor.

## 2019-01-12 NOTE — Progress Notes (Signed)
Specimen [id number] sent for [facility] testing per Dr Lorette Ang request.

## 2019-01-12 NOTE — Progress Notes (Signed)
SATURATION QUALIFICATIONS: (This note is used to comply with regulatory documentation for home oxygen)  Patient Saturations on Room Air at Rest = 82%  Patient Saturations on Room Air while Ambulating = 79%  Patient Saturations on 2 Liters of oxygen while Ambulating = 90%  Please briefly explain why patient needs home oxygen:

## 2019-01-12 NOTE — Progress Notes (Signed)
Long discussion with patient, initiated by phone call from sister [place].  Note to all treatment team:  Patient does NOT want any information regarding his care shared with his sister, Valerie Salts.    Patient plans to return to his home at discharge.  Prior to admission, patient lived with his mother and an older sister here in [place].  Patient plans to return to this environment.  He is open to receiving home health services thru Hospice if that is an option.  However, if not, he already has [facility] plans initiated w/[facility] from [facility] stay at [facility].  RN will advise CSW/CM on discharge plans.

## 2019-01-12 NOTE — TOC Transition Note (Addendum)
Transition of Care [facility]) - CM/SW Discharge Note   Patient Details  Name: Andrew Davenport MRN: [id number] Date of Birth: [birth year]  [facility] CM/SW Contact:  Zenon Mayo, RN Phone Number: 01/12/2019, 1:51 PM   Clinical Narrative:    Patient from home with Mom and sister, he is for dc today back home, he has pigtail drain from [place], but only has two in the box, he will need more drains and we do not carry the type of drains he has.  NCM called [facility] and spoke with house officer, Vaughan Basta, to see if we could get some drains for this patient before he goes home. He is already active with [facility] for [facility] , they still have auth from the [place] per Tiffany with [facility],  Also will need home oxygen, he only has [place] insurance so [place] will have to do the home oxygen. NCM informed the RN patient can not go home until we make sure he will have drains.  NCM faxed information to [place] at 1520. NCM faxed information to [facility] at the [place] three times and the fax did not go thru, [place] with the [place] states they have not been able to get any faxes all day.  He gave me the respiratory therapist phone [phone] ext [phone],  NCM called this number and spoke with Janett Billow, she states their fax is not working either, she states she will see if she has a MD in to let her know what to do, because she can not take a verbal order.  NCM awaiting call back.  NCM received call back stating they will have to process this in the morning, NCM notified MD of this information.  Also patient will get 10 drains from [facility] to go home with from [employer].  8/12 0848 Tomi Bamberger RN, BSN - NCM spoke with Graylon Good at the [facility] this am [phone] ext [phone],  She states she has everything she needs and she will call in the order.  She states they will deliver the oxygen to patient's home and his sister will be there and once they receive the oxygen at the home she will bring a tank to pick the patient up with to go  home.  Caryl Pina states they can take up to 4 hours to deliver it from the time she orders it, but she is ordering it this am.  Also she will contact patient.   Final next level of care: [place] Barriers to Discharge: Equipment Delay   Patient Goals and CMS Choice Patient states their goals for this hospitalization and ongoing recovery are:: get better CMS Medicare.gov Compare Post Acute Care list provided to:: Patient Choice offered to / list presented to : Patient  Discharge Placement                       Discharge Plan and Services                DME Arranged: Oxygen DME Agency: [employer] Date DME Agency Contacted: 01/12/19 Time DME Agency Contacted: 0321 Representative spoke with at DME Agency: zack HH Arranged: RN [place] Agency: [facility] [employer] [facility] [employer] Date [place]: 01/12/19 Time [place]: [place] Representative spoke with at [place]: [place] ([place]) Interventions     Readmission Risk Interventions No flowsheet data found.

## 2019-01-12 NOTE — Discharge Instructions (Signed)
Thank you for allowing [place] to participate in your care!    You will need to follow up with oncology. Please continue draining your pleurX.   If you experience worsening of your admission symptoms, develop shortness of breath, life threatening emergency, suicidal or homicidal thoughts you must seek medical attention immediately by calling 911 or calling your MD immediately  if symptoms less severe.  Thoracoscopy, Care After This sheet gives you information about how to care for yourself after your procedure. Your health care provider may also give you more specific instructions. If you have problems or questions, contact your health care provider. What can I expect after the procedure? After the procedure, it is common to have pain and soreness in the surgical area. Follow these instructions at home: Incision care   Follow instructions from your health care provider about how to take care of your incision. Make sure you: ? Wash your hands with soap and water before you change your bandage (dressing). If soap and water are not available, use hand sanitizer. ? Change your dressing as told by your health care provider. ? Leave stitches (sutures), skin glue, or adhesive strips in place. These skin closures may need to stay in place for 2 weeks or longer. If adhesive strip edges start to loosen and curl up, you may trim the loose edges. Do not remove adhesive strips completely unless your health care provider tells you to do that.  Check your incision areas every day for signs of infection. Check for: ? Redness, swelling, or pain. ? Fluid or blood. ? Warmth. ? Pus or a bad smell.  Do not take baths, swim, or use a hot tub until your health care provider approves. You may take showers. Medicines  Take over-the-counter and prescription medicines only as told by your health care provider.  If you were prescribed an antibiotic medicine, take it as told by your health care provider. Do not stop  taking the antibiotic even if you start to feel better.  Do not drive or use heavy machinery while taking prescription pain medicine.  If you are taking prescription pain medicine, take actions to prevent or treat constipation. Your health care provider may recommend that you: ? Drink enough fluid to keep your urine pale yellow. ? Eat foods that are high in fiber, such as fresh fruits and vegetables, whole grains, and beans. ? Limit foods that are high in fat and processed sugars, such as fried and sweet foods. ? Take an over-the-counter or prescription medicine for constipation. Managing pain, stiffness, and swelling   If directed, put ice on the affected area: ? Put ice in a plastic bag. ? Place a towel between your skin and the bag. ? Leave the ice on for 20 minutes, 2-3 times a day. Preventing lung infection  To prevent pneumonia and to keep your lungs healthy: ? Try to cough often. If it hurts to cough, hold a pillow against your chest as you cough. ? Take deep breaths or do breathing exercises as instructed by your health care provider. ? If you were given an incentive spirometer, use it as directed by your health care provider. General instructions  Do not lift anything that is heavier than 10 lb (4.5 kg), or the limit that you are told, until your health care provider says that it is safe.  Do not use any products that contain nicotine or tobacco, such as cigarettes and e-cigarettes. These can delay healing after surgery. If you need help  quitting, ask your health care provider.  Avoid driving until your health care provider approves.  If you have a chest drainage tube, care for it as instructed by your health care provider. Do not travel by airplane after the chest drainage tube is removed until your health care provider approves.  Keep all follow-up visits as told by your health care provider. This is important. Contact a health care provider if:  You have a  fever.  Pain medicines do not ease your pain.  You have redness, swelling, or increasing pain in your incision area.  You develop a cough that does not go away, or you are coughing up mucus that is yellow or green. Get help right away if:  You have fluid, blood, or pus coming from your incision.  There is a bad smell coming from your incision or dressing.  You develop a rash.  You cough up blood.  You develop light-headedness, or you feel faint.  You have difficulty breathing.  You develop chest pain.  Your heartbeat feels irregular or very fast. These symptoms may represent a serious problem that is an emergency. Do not wait to see if the symptoms will go away. Get medical help right away. Call your local emergency services (911 in the [place] Do not drive yourself to the hospital. Summary  Follow instructions from your health care provider about how to take care of your incision.  Do not drive or use heavy machinery while taking prescription pain medicine.  Leave stitches (sutures), skin glue, or adhesive strips in place.  Check your incision areas every day for signs of infection. This information is not intended to replace advice given to you by your health care provider. Make sure you discuss any questions you have with your health care provider. Document Released: 12/07/2004 Document Revised: 05/02/2017 Document Reviewed: 04/29/2017 Elsevier Patient Education  2020 [employer].             High-Calorie, High- Protein Nutrition Therapy  A high-calorie, high-protein diet has been recommended for you either because you cant eat enough calories throughout the day, have lost weight, or need to add protein to your diet. Following the recommendations on this handout can help you:  Gain weight and give your body energy  Get more protein from foods that help your body heal and grow strong Recover from surgery or illness  Tips to Eat More Calories and  Protein:  1. Aim for at [facility] 6 Meals and Snacks Each Day  Extra meals and snacks can help you get enough calories and protein.  You may want to try high-calorie supplement drinks (made at home or bought at a store) periodically between meals to get more calories each day. ? If you buy the drink at the store, read the label to look for products with 200-400 calories per serving. ? If you make the drink at home, you can increase calories by adding protein ingredients such as nonfat milk, low-fat yogurt, nonfat milk powder, or protein powder.  Enjoy snacks such as milkshakes, smoothies, pudding, ice cream, or custard.  2. Eat More Fat  Fat provides a lot of calories in just a few bites. A tablespoon of oil, butter, or margarine has about 100 calories.  Add butter, margarine, or oil to bread, potatoes, vegetables, and soups.  Use mayonnaise, salad dressing, and peanut butter freely.  3. Choose High-Protein Foods  Enjoy milk, eggs, cheese, meat, fish, poultry, and beans. Consider trying protein powders and meal replacement shakes  and bars.  Choose higher-fat meats. They have more calories than lean meats. ? Examples include chicken thighs, marbled meats, bacon, sausage, poultry with skin  Choose whole milk instead of low-fat or skim milk.  Eat high-fat cheeses instead of low-fat or nonfat cheeses.  4. Shopping Tips  Avoid diet, low-calorie, or low-fat food items.  Look for dairy products (milk, cheese, yogurt, cottage cheese) that are labeled whole fat or have at least 4% fat.  Purchase nonfat dry milk powder or protein powder to use to make shakes or other blended recipes.   5. Cooking Tips  Make a high-protein milk recipe like the one below. The recipe can be prepared in advance and stored in the refrigerator until you are ready to drink it. Use this high-protein milk in recipes that call for milk or drink it as a beverage.  ? 1 cup whole milk ?  cup nonfat dry milk  powder  Add cheese sauce, butter, and sour cream to vegetable and potato dishes.  Get extra calories by adding condensed milk, cream, butter, nut butters, and sweetener to hot cereals, mashed potato, pudding, and soups. Examples: ? Prepare oatmeal with condensed milk, butter/nut butter, and brown sugar ? Prepare mashed potatoes with cream, butter, and cheese ? Prepare soup with cream and extra butter, or puree the soup with cream to make a bisque ? Add cream to pudding mix or use pudding dry mix in cakes/baked goods  Serve items with extra sauces. These contain additional calories: ? Gravy on meats and potatoes ? Extra mayonnaise, BBQ sauce or ketchup  Dipping sauces, hummus, and regular (not low-fat/low-calorie) salad dressing     Foods Recommended Calories Protein in grams (g)      Protein Foods    1 cup cooked dried beans 240 14   cup chicken salad 200 14  1 egg cooked with 1 tablespoon butter 175 6  3 ounces tuna canned in oil 170 25   cup egg substitute 25 5      1  ounce pecans (20 halves) 200 3  1 ounce macadamia nuts (10-12 nuts) 200 2  1 ounce [place] nuts (6-8 nuts) 190 4  1 ounce walnuts (14 halves) 185 4  1 ounce shelled sunflower seeds 175 6  1 ounce almonds (about 24) 165 4  1 ounce peanuts 165 7  1 tablespoon peanut butter 95 4       cup canned evaporated milk (can be used instead of water when cooking) 170 9  6 ounces sweetened yogurt 165 6   cup ice cream 130 2-3   cup (1 ounce) shredded cheese 115 7   cup creamed cottage cheese 110 13   cup half-and-half 80 2   cup whole milk (can be used instead of water when cooking) 75 4  1 tablespoon cream cheese 50 1  2 tablespoons sour cream 50 0      Fats    1 tablespoon butter, margarine, oil, or mayonnaise 100 0  2 tablespoons gravy 4 1      Sweets    1 tablespoon honey 60 0  1 tablespoon sugar, jam, jelly, or chocolate syrup 50 0      Meal Replacements    1 meal  replacement bar 200 15  1 scoop (1 ounce) protein powder 100 15  1 tablespoon protein powder 40 5    High-Calorie, High-Protein Sample 1-Day Menu Breakfast 1 large egg, scrambled 1 medium biscuit 1 tablespoon jam 2 tablespoon butter 1  cup apple juice  Morning Snack 1/4 cup peanuts 1/4 cup raisins  Lunch 4 oz tuna salad (with mayonnaise, oil, relish) 1 hard-boiled egg 2 canned peach halves 2 tablespoons cream cheese 4 walnut halves 1 cup grape juice  Afternoon Snack 1/2 cup orange juice in smoothie 1/4 cup frozen strawberries in smoothie 1 banana in smoothie 1 oz protein powder in smoothie  Evening Meal 3 oz ground beef patty 2 tablespoons gravy 3 large stalks broccoli 2 tablespoons cheese sauce 2 slices bread 1 tablespoon butter  Evening Snack 1/2 cup hummus 1 whole wheat pita

## 2019-01-13 ENCOUNTER — Other Ambulatory Visit: Payer: Self-pay

## 2019-01-13 LAB — AEROBIC/ANAEROBIC CULTURE W GRAM STAIN (SURGICAL/DEEP WOUND): Culture: NO GROWTH

## 2019-01-13 LAB — GLUCOSE, CAPILLARY
Glucose-Capillary: 127 mg/dL — ABNORMAL HIGH (ref 70–99)
Glucose-Capillary: 113 mg/dL — ABNORMAL HIGH (ref 70–99)

## 2019-01-13 MED ORDER — ZOLPIDEM TARTRATE 5 MG PO TABS: 5.0000 mg | ORAL_TABLET | Freq: Every evening | ORAL | 0 refills | Status: DC | PRN

## 2019-01-13 MED ORDER — HYDROCODONE-ACETAMINOPHEN 5-325 MG PO TABS: 2.0000 | ORAL_TABLET | Freq: Four times a day (QID) | ORAL | 0 refills | Status: DC

## 2019-01-13 NOTE — Discharge Summary (Signed)
[facility] Discharge Summary  Patient name: Andrew Davenport Medical record number: [id number] Date of birth: 24-Jun-1955 Age: 63 y.o. Gender: male Date of Admission: 01/06/2019  Date of Discharge: 01/13/2019 Admitting Physician: [name], MD  Primary Care Provider: Patient, No Pcp Per Consultants: CVTS, oncology, palliative  Indication for Hospitalization: shortness of breath  Discharge Diagnoses/Problem List:  Stage IV adenocarcinoma of lung  Right malignant pleural effusion Type 2 diabetes mellitus GERD  Disposition: home with palliative care  Discharge Condition: stable  Discharge Exam:  General: NAD, pleasant, sitting up on bed without O2 Cardiovascular:  no LE edema Respiratory: normal work of breathing on RA Gastrointestinal: soft, nontender, nondistended MSK: moves 4 extremities equally Derm: no rashes appreciated Neuro: CN II-XII grossly intact Psych: AOx3, appropriate affect  Brief Hospital Course:  Andrew Davenport  Is a 63 yo male who was admitted following a 6-week history of progressive shortness of breath, right-sided chest pain and a change in his pleural fluid color from dark red to straw yellow.  He had been admitted prior to [place] with similar symptoms and found to have a right-sided pleural effusion imaging. Patient had had 3 X thoracocentesis at this hospital with pleural fluid awaiting cytology and discharged with a Pleurx drain. He was given a diagnosis of non-small cell carcinoma of the lung when he presented to the ED at [facility]. CVTS were consulted and he had VATS with biopsy on 8/7.  Biopsy results unfortunately showed adenocarcinoma of the lung.  Immunohistochemistry is pending.  Pt's pain was managed with Norco 10mg  Q4hrly, ibuprofen 600mg  Q6hrly and tramadol 50-100mg  Q6PRN prior and post operatively- morphine PCA. Patient had significant pleuritic pain from cancer found in pleural lining.   Oncology were  consulted who advised likely stage 4 adenocarcinoma. Requested MRI brain which showed no evidence of metastatic disease and CT abdomen and pelvis which showed: No definite findings in the abdomen or pelvis to strongly suggest metastatic disease. 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 Haziness in the root of the small bowel mesentery inferior to the pancreas. Lipase level normal.    Palliative care were consulted and had discussions with [facility] regarding his goals of care. He understands his cancer is a terminal illness. Would like to maintain the best quality of life in order to spend time with his grandchildren. He is not interested in chemotherapy if it will deteriorate his quality of life. Pt is now DNR.  Issues for Follow Up:  1. Hospice and Palliative services follow up as outpatient.  2. Oncology follow up on 01/19/19 3. F/u with CVTS outpatient in 2-3 weeks. They have made appointment 4. Follow-up on immunohistochemistry of the lung biopsy  Significant Procedures:  VATS with biopsy and pleurX placement 8/7  Significant Labs and Imaging:  Recent Labs  Lab 01/09/19 0354 01/10/19 0222 01/11/19 0240  WBC 16.5* 14.2* 11.6*  HGB 10.7* 11.4* 10.6*  HCT 32.9* 35.8* 32.6*  PLT 404* 431* 371   Recent Labs  Lab 01/07/19 0617 01/07/19 2242 01/09/19 0354 01/10/19 0222 01/11/19 0240  NA 130* 129* 133* 133* 131*  K 3.7 3.9 4.3 4.1 4.3  CL 90* 89* 94* 95* 97*  CO2 24 27 26 24 24   GLUCOSE 116* 163* 141* 151* 159*  BUN 9 14 6 11 10   CREATININE 0.92 1.03 0.73 0.85 0.69  CALCIUM 9.2 9.0 9.0 9.1 8.8*  ALKPHOS 78 96  --  83  --   AST 32 27  --  27  --   ALT 37 35  --  29  --   ALBUMIN 3.1* 3.1*  --  3.1*  --    Mr Andrew Davenport Wo Contrast  Result Date: 01/12/2019 CLINICAL DATA:  Lung cancer staging EXAM: MRI HEAD WITHOUT AND WITH CONTRAST TECHNIQUE: Multiplanar, multiecho pulse sequences of the brain and  surrounding structures were obtained without and with intravenous contrast. CONTRAST:  10 cc Gadavist intravenous COMPARISON:  None. FINDINGS: Brain: No evidence of enhancement or swelling to suggest metastatic disease. Axial and coronal postcontrast imaging is moderately degraded by motion, which could obscure subtle findings. Postcontrast sagittal imaging is nondiagnostic due to motion. No infarct, hemorrhage, hydrocephalus, or collection. Age normal brain volume Vascular: Major flow voids and vascular enhancements are preserved Skull and upper cervical spine: Negative for marrow lesion Sinuses/Orbits: Left cataract resection.  Negative for mass. IMPRESSION: No evidence of metastatic disease. Moderately motion degraded study due to claustrophobia Electronically Signed   By: Monte Fantasia M.D.   On: 01/12/2019 11:35   Dg Chest Port 1 View  Result Date: 01/10/2019 CLINICAL DATA:  63 year old male with history of pleural effusion. EXAM: PORTABLE CHEST 1 VIEW COMPARISON:  Chest x-ray 01/09/2019. FINDINGS: Right-sided chest tube in position with tip projecting over the lower right hemithorax. Moderate right pleural effusion again noted. No appreciable right pneumothorax confidently identified on today's examination. Irregular opacities throughout the right mid to lower lung may reflect areas of atelectasis and/or consolidation. A few scattered ill-defined reticulonodular opacities are noted throughout the left mid to lower lung, corresponding to scattered areas of nodularity seen on prior CT the abdomen and pelvis 01/07/2019. Left lung is otherwise clear. No left pleural effusion. No evidence of pulmonary edema. Heart size is normal. IMPRESSION: 1. Read chemo elation of right-sided pleural fluid. Moderate pleural effusion noted at this time. No residual pneumothorax confidently identified. 2. Patchy ill-defined opacities throughout the lungs bilaterally (right greater than left). Based on correlation with prior  chest CT, findings are favored to reflect metastatic disease with potential lymphangitic spread of tumor in the right lung. Underlying infectious consolidation is not entirely excluded, but is not strongly favored. Electronically Signed   By: Vinnie Langton M.D.   On: 01/10/2019 09:41    Results/Tests Pending at Time of Discharge:  Immunohistochemistry of the lung biopsy  Discharge Medications:  Allergies as of 01/13/2019   No Known Allergies     Medication List    TAKE these medications   HYDROcodone-acetaminophen 5-325 MG tablet Commonly known as: NORCO/VICODIN Take 2 tablets by mouth every 6 (six) hours for 7 days.   ibuprofen 600 MG tablet Commonly known as: ADVIL Take 1 tablet (600 mg total) by mouth every 6 (six) hours.   metFORMIN 750 MG 24 hr tablet Commonly known as: GLUCOPHAGE-XR Take 750 mg by mouth daily with breakfast.   multivitamin with minerals Tabs tablet Take 1 tablet by mouth daily.   pantoprazole 40 MG tablet Commonly known as: PROTONIX Take 40 mg by mouth daily.   polyethylene glycol 17 g packet Commonly known as: MIRALAX / GLYCOLAX Take 17 g by mouth daily.   senna-docusate 8.6-50 MG tablet Commonly known as: Senokot-S Take 1 tablet by mouth at bedtime.   zolpidem 5 MG tablet Commonly known as: AMBIEN Take 1 tablet (5 mg total) by mouth at bedtime as needed and may repeat dose one time if needed for sleep.  Durable Medical Equipment  (From admission, onward)         Start     Ordered   01/12/19 1348  For home use only DME oxygen  Once    Question Answer Comment  Length of Need 12 Months   Mode or (Route) Nasal cannula   Liters per Minute 4   Frequency Continuous (stationary and portable oxygen unit needed)   Oxygen delivery system Gas      01/12/19 1347          Discharge Instructions: Please refer to Patient Instructions section of EMR for full details.  Patient was counseled important signs and symptoms that  should prompt return to medical care, changes in medications, dietary instructions, activity restrictions, and follow up appointments.   Follow-Up Appointments: Follow-up Information    [name], MD. Go on 02/02/2019.   Specialty: Cardiothoracic Surgery Why: PA/LAT CXR to be taken (at [facility] which is in the same building as Dr. Leonarda Salon office) on 09/01 at 12:15 pm;Appointment time is at 12:45 pm. Please bring record of pleur x drainage results Contact information: [place] [address] [phone]        Tish Men, MD. Go on 01/19/2019.   Specialties: Hematology, Oncology Why: Your appointment is scheduled for 2:30pm. Please arrive 15 mintues early.  Contact information: [place] [address] [phone]        Home, [facility] Follow up.   Specialty: Home Health Services Why: RN Contact information: [address] [place] [address] [phone]           Andreanna Mikolajczak, [place], DO 01/13/2019, 8:04 AM PGY-3, [place]

## 2019-01-13 NOTE — Progress Notes (Signed)
Discharged home by wheelchair accompanied by daughter,  Discharge instructions and prescription given to pt. Supply given to  Pt. Belongings taken home.

## 2019-01-13 NOTE — Progress Notes (Signed)
As per report pt refused cardiac monitoring, no iv line,  since yesterday. Explained the significance of taking v/s but claimed that he is going home. MD aware with order for discharge.

## 2019-01-13 NOTE — Progress Notes (Signed)
IV removed by RN per pt request. Pt currently with no IV access. Will continue to monitor.

## 2019-01-13 NOTE — Progress Notes (Signed)
Opened in error. Ottis Stain, CMA

## 2019-01-14 ENCOUNTER — Other Ambulatory Visit: Payer: Self-pay | Admitting: *Deleted

## 2019-01-14 ENCOUNTER — Encounter: Payer: Self-pay | Admitting: *Deleted

## 2019-01-14 DIAGNOSIS — Z4800 Encounter for change or removal of nonsurgical wound dressing: Secondary | ICD-10-CM | POA: Diagnosis not present

## 2019-01-14 DIAGNOSIS — C3490 Malignant neoplasm of unspecified part of unspecified bronchus or lung: Secondary | ICD-10-CM

## 2019-01-14 NOTE — Progress Notes (Signed)
Foundation One and PDL 1 sent today.

## 2019-01-14 NOTE — Progress Notes (Signed)
The proposed treatment discussed in cancer conference 01/14/2019 is for discussion purpose only and is not a binding recommendation.  The patient was not physically examined nor present for their treatment options.  Therefore, final treatment plans cannot be decided.

## 2019-01-15 ENCOUNTER — Telehealth: Payer: Self-pay | Admitting: Hematology

## 2019-01-15 NOTE — Telephone Encounter (Signed)
Called and spoke with patient regarding appointments per 8/6 staff message

## 2019-01-18 ENCOUNTER — Other Ambulatory Visit: Payer: Self-pay | Admitting: Hematology

## 2019-01-18 ENCOUNTER — Telehealth: Payer: Self-pay | Admitting: Hematology

## 2019-01-18 DIAGNOSIS — C3490 Malignant neoplasm of unspecified part of unspecified bronchus or lung: Secondary | ICD-10-CM

## 2019-01-18 NOTE — Telephone Encounter (Signed)
Spoke w/ Paralee Cancel regarding cancelling appointments for 8/18.  Patient is unable to come in she states due to breathing issues and not being mobile.  He went to [facility] on Thursday 8/16 and [facility] came out on Saturday to drain pleurex tube.  She is awaiting a call from home health regarding his breathing issues.  I did instruct her that if he wads having difficulty breathing she should cal 911 ASAP. She voiced understanding and the end of our phone call.  I also gave her my name & number to call back should she have any further questions

## 2019-01-18 NOTE — Progress Notes (Deleted)
[place] OFFICE PROGRESS NOTE  Patient Care Team: Patient, No Pcp Per as PCP - General (General Practice)  HEME/ONC OVERVIEW: 1. 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 Malignant R pleural effusion s/p Pleur-X  -Pleur-X placed in late 12/2018 at [place]:  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 -Pathology from the VATS showed adenocarcinoma; given the hx of extensive tobacco use, this is consistent with primary lung malignancy -However,, TTF-1 and napsin A were negative, which is somewhat unusual for lung malignancy; furthermore, CT abdomen/pelvis in 12/2018 showed questionable gallbladder mass, but subsequent CT's showed normal gallbladder appearance  -Therefore, I have ordered PET scan to assess for any other primary malignancy while awaiting the molecular study results  -I have also ordered CA 19-9 and CEA to see if they are elevated, which may suggest GI malignancy   Malignant right pleural effusion -Continue daily to q2day drainage; HH following   Hyponatremia -Na ____  Normocytic anemia -Most likely secondary to anemia of chronic disease  Goals of care discussion   No orders of the defined types were placed in this encounter.   All questions were answered. The patient knows to call the clinic with any problems, questions or concerns. No barriers to learning was detected.  A total of more than {CHL ONC TIME VISIT - [id number]} were spent face-to-face with the patient during this encounter and over half of that time was spent on counseling and  coordination of care as outlined above.   Tish Men, MD 01/18/2019 12:49 PM  CHIEF COMPLAINT: "I am here for *** "  INTERVAL HISTORY:   SUMMARY OF ONCOLOGIC HISTORY: Oncology History  Malignant neoplasm of lung [facility])   Initial Diagnosis   Malignant neoplasm of lung ([place])   12/20/2018 Imaging   CTA chest: IMPRESSION: 1.  Large unilateral right pleural effusion, possibly malignant. Thoracentesis with cytology should be considered for further assessment.  2.  Near complete collapse/compressive atelectasis of the right lung. 3.  Bilateral nodules may be infectious or related to metastatic disease. 4.  Mediastinal shift to the left as a result of the large right pleural effusion.  5.  No central pulmonary embolism identified. However, evaluation is limited. 6.  Indeterminate hypervascular focus in the peripheral left hepatic lobe may relate to flash fill hemangioma.  7.  Probable left renal cysts.   12/26/2018 Imaging   CT abdomen/pelvis: IMPRESSION: 1.  Moderately large right pleural effusion with compressive right lung atelectasis. The right pleural effusion appears slightly smaller. 2.  Numerous bilateral ill-defined lung nodules without apparent change 3.   Gallstone versus gallbladder mass. Recommend gallbladder ultrasound for further evaluation   12/30/2018 Imaging   CT chest w/o contrast: IMPRESSION: A right-sided pleural drain is now present. The moderately large right pleural effusion and diffuse right pleural thickening have not significantly changed. The adjacent right lung consolidation/ atelectasis is similar. The multiple bilateral solid and  groundglass pulmonary nodules are unchanged.    12/31/2018 Pathology Results   Right Pleural Fluid (ThinPrep, cell block):  Scattered malignant cells present, see comment. Marked acute inflammation and blood.  Cytotechnologist: Lorel Monaco, CT(ASCP)   01/07/2019 Imaging  CT abdomen/pelvis: IMPRESSION: 1. No definite findings  in the abdomen or pelvis to strongly suggest the presence of metastatic disease. 2. 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 This could be definitively characterized with MRI of the abdomen with and without IV gadolinium if of clinical concern, or could simply be followed on routine follow-up examinations. 3. Haziness in the root of the small bowel mesentery inferior to the pancreas. This can sometimes be seen and can be benign. Alternatively, this could be seen in the setting of mild acute pancreatitis. Correlation with lipase levels is suggested. 4. Aortic atherosclerosis.   01/08/2019 Pathology Results   Accession: [id number] Diagnosis 1. Pleura, biopsy, Right - ADENOCARCINOMA. 2. Pleura, peel, Right - MALIGNANT CELLS CONSISTENT WITH NON-SMALL CELL CARCINOMA AND ABUNDANT FIBRINOUS EXUDATE. 3. Pleura, biopsy, Right - ADENOCARCINOMA.  ADDITIONAL INFORMATION: 1. The tumor is negative with immunohistochemistry for Napsin A and thyroid transcription factor-1. The differential therefore includes primary lung adenocarcinoma and metastatic adenocarcinoma.   01/12/2019 Imaging   MRI brain:   IMPRESSION: No evidence of metastatic disease.   Moderately motion degraded study due to claustrophobia     REVIEW OF SYSTEMS:   Constitutional: ( - ) fevers, ( - )  chills , ( - ) night sweats Eyes: ( - ) blurriness of vision, ( - ) double vision, ( - ) watery eyes Ears, nose, mouth, throat, and face: ( - ) mucositis, ( - ) sore throat Respiratory: ( - ) cough, ( - ) dyspnea, ( - ) wheezes Cardiovascular: ( - ) palpitation, ( - ) chest discomfort, ( - ) lower extremity swelling Gastrointestinal:  ( - ) nausea, ( - ) heartburn, ( - ) change in bowel habits Skin: ( - ) abnormal skin rashes Lymphatics: ( - ) new lymphadenopathy, ( - ) easy  bruising Neurological: ( - ) numbness, ( - ) tingling, ( - ) new weaknesses Behavioral/Psych: ( - ) mood change, ( - ) new changes  All other systems were reviewed with the patient and are negative.  I have reviewed the past medical history, past surgical history, social history and family history with the patient and they are unchanged from previous note.  ALLERGIES:  has No Known Allergies.  MEDICATIONS:  Current Outpatient Medications  Medication Sig Dispense Refill  . HYDROcodone-acetaminophen (NORCO/VICODIN) 5-325 MG tablet Take 2 tablets by mouth every 6 (six) hours for 7 days. 56 tablet 0  . metFORMIN (GLUCOPHAGE-XR) 750 MG 24 hr tablet Take 750 mg by mouth daily with breakfast.    . Multiple Vitamin (MULTIVITAMIN WITH MINERALS) TABS tablet Take 1 tablet by mouth daily. 30 tablet 0  . pantoprazole (PROTONIX) 40 MG tablet Take 40 mg by mouth daily.    . polyethylene glycol (MIRALAX / GLYCOLAX) 17 g packet Take 17 g by mouth daily. 14 each 0  . senna-docusate (SENOKOT-S) 8.6-50 MG tablet Take 1 tablet by mouth at bedtime. 30 tablet 0  . zolpidem (AMBIEN) 5 MG tablet Take 1 tablet (5 mg total) by mouth at bedtime as needed and may repeat dose one time if needed for sleep. 20 tablet 0   No current facility-administered medications for this visit.     PHYSICAL EXAMINATION: ECOG PERFORMANCE STATUS: {CHL ONC ECOG PS:[phone]}  There were no vitals filed for this visit. There is no height or weight on file to calculate BMI.  There  were no vitals filed for this visit.  GENERAL: alert, no distress and comfortable SKIN: skin color, texture, turgor are normal, no rashes or significant lesions EYES: conjunctiva are pink and non-injected, sclera clear OROPHARYNX: no exudate, no erythema; lips, buccal mucosa, and tongue normal  NECK: supple, non-tender LYMPH:  no palpable lymphadenopathy in the cervical LUNGS: clear to auscultation with normal breathing effort HEART: regular rate &  rhythm and no murmurs and no lower extremity edema ABDOMEN: soft, non-tender, non-distended, normal bowel sounds Musculoskeletal: no cyanosis of digits and no clubbing  PSYCH: alert & oriented x 3, fluent speech NEURO: no focal motor/sensory deficits  LABORATORY DATA:  I have reviewed the data as listed    Component Value Date/Time   NA 131 (L) 01/11/2019 0240   K 4.3 01/11/2019 0240   CL 97 (L) 01/11/2019 0240   CO2 24 01/11/2019 0240   GLUCOSE 159 (H) 01/11/2019 0240   BUN 10 01/11/2019 0240   CREATININE 0.69 01/11/2019 0240   CALCIUM 8.8 (L) 01/11/2019 0240   PROT 6.3 (L) 01/10/2019 0222   ALBUMIN 3.1 (L) 01/10/2019 0222   AST 27 01/10/2019 0222   ALT 29 01/10/2019 0222   ALKPHOS 83 01/10/2019 0222   BILITOT 0.3 01/10/2019 0222   GFRNONAA >60 01/11/2019 0240   GFRAA >60 01/11/2019 0240    No results found for: SPEP, UPEP  Lab Results  Component Value Date   WBC 11.6 (H) 01/11/2019   NEUTROABS 7.0 01/06/2019   HGB 10.6 (L) 01/11/2019   HCT 32.6 (L) 01/11/2019   MCV 85.6 01/11/2019   PLT 371 01/11/2019      Chemistry      Component Value Date/Time   NA 131 (L) 01/11/2019 0240   K 4.3 01/11/2019 0240   CL 97 (L) 01/11/2019 0240   CO2 24 01/11/2019 0240   BUN 10 01/11/2019 0240   CREATININE 0.69 01/11/2019 0240      Component Value Date/Time   CALCIUM 8.8 (L) 01/11/2019 0240   ALKPHOS 83 01/10/2019 0222   AST 27 01/10/2019 0222   ALT 29 01/10/2019 0222   BILITOT 0.3 01/10/2019 0222       RADIOGRAPHIC STUDIES: I have personally reviewed the radiological images as listed below and agreed with the findings in the report. Dg Chest 2 View  Result Date: 01/06/2019 CLINICAL DATA:  Shortness of breath. Chest pain. Pleural effusion. Chest tube. EXAM: CHEST - 2 VIEW COMPARISON:  No recent prior. FINDINGS: Heart size normal. Right base atelectasis/infiltrate and right-sided pleural effusion. Right chest tube noted with tip over the right lower chest. No  pneumothorax. Mild patchy infiltrates in the left lung cannot be excluded. Degenerative change thoracic spine. IMPRESSION: 1. Right base atelectasis/infiltrate right-sided pleural effusion. Right chest tube noted with tip over the right lower chest. No pneumothorax. 2. Mild patchy infiltrates in left lung cannot be excluded. Electronically Signed   By: Marcello Moores  Register   On: 01/06/2019 09:27   Ct Chest W Contrast  Result Date: 01/06/2019 CLINICAL DATA:  Chest pain, shortness of breath, back pain and recent history of right pleural effusion with tunneled PleurX catheter placement at outside institution. Chest x-ray demonstrates component of right-sided pleural fluid and pulmonary airspace opacity. EXAM: CT CHEST WITH CONTRAST TECHNIQUE: Multidetector CT imaging of the chest was performed during intravenous contrast administration. CONTRAST:  58mL OMNIPAQUE IOHEXOL 300 MG/ML  SOLN COMPARISON:  Chest x-ray earlier today. FINDINGS: Cardiovascular: The ascending thoracic aorta is mildly dilated and measures 4 cm in  maximal measured diameter. The aortic root is normal in caliber and measures 3.4-3.6 cm. The proximal arch measures 3.8 cm and the distal arch 2.8 cm. The descending thoracic aorta measures 2.6 cm. No evidence of aortic dissection. Proximal great vessels are normally patent and demonstrate bovine branching anatomy. The pulmonary arteries are also well opacified and demonstrates normal caliber without visible pulmonary embolism. Mediastinum/Nodes: No evidence of mediastinal lymphadenopathy or mass. Mildly prominent right hilar lymph node measures approximately 1.5 cm. Unremarkable thyroid gland, of CT appearance of the esophagus and trachea. Lungs/Pleura: Tunneled pleural drainage catheter enters the lateral pleural space and ascends posteriorly. There is a small amount of loculated pleural fluid present in the pleural space with associated pleural thickening. Parenchymal atelectasis and scarring present  with lack of complete expansion of the right lung. There are multiple bilateral ill-defined pulmonary nodules in both upper and lower lobes. These are scattered predominantly in a peripheral distribution and are all approximately 7 mm or less in size. These are not smoothly demarcated nodules and may be inflammatory in nature. However, metastatic disease cannot be excluded and correlation suggested with any known diagnosis of malignancy. No pneumothorax, pulmonary edema or significant focal airspace consolidation is identified. Upper Abdomen: Focal hypervascular focus in a relatively arterial phase of imaging in the liver in segment III measures approximately 1.7 cm in greatest diameter. Differential considerations include hemangioma, perfusion anomaly or hypervascular mass. No other focal abnormalities are identified in the visualized upper to mid liver. Musculoskeletal: No fractures or focal bony lesions identified. IMPRESSION: 1. Mildly dilated ascending thoracic aorta measures 4 cm in greatest diameter. Recommend annual imaging followup by CTA or MRA. This recommendation follows 2010 ACCF/AHA/AATS/ACR/ASA/SCA/SCAI/SIR/STS/SVM Guidelines for the Diagnosis and Management of Patients with Thoracic Aortic Disease. Circulation. 2010; 121: S063-K160. Aortic aneurysm NOS (ICD10-I71.9) 2. Mildly enlarged right hilar lymph node measures 1.5 cm in short axis. 3. Small amount of loculated right pleural fluid with associated pleural thickening and restricted expansion of the right lung. Indwelling PleurX catheter present. 4. Multiple ill-defined bilateral small pulmonary nodules all measuring under 1 cm. These may be inflammatory in nature based on appearance. However, metastatic disease is not excluded and correlation suggested with any known diagnosis of malignancy. 5. 1.7 cm hypervascular lesion in segment III of the liver with differential including hemangioma, perfusion anomaly or hypervascular mass. Correlation  suggested with any prior abdominal CT or MRI imaging. Aortic aneurysm NOS (ICD10-I71.9). Electronically Signed   By: Aletta Edouard M.D.   On: 01/06/2019 14:19   Mr Jeri Cos FU Contrast  Result Date: 01/12/2019 CLINICAL DATA:  Lung cancer staging EXAM: MRI HEAD WITHOUT AND WITH CONTRAST TECHNIQUE: Multiplanar, multiecho pulse sequences of the brain and surrounding structures were obtained without and with intravenous contrast. CONTRAST:  10 cc Gadavist intravenous COMPARISON:  None. FINDINGS: Brain: No evidence of enhancement or swelling to suggest metastatic disease. Axial and coronal postcontrast imaging is moderately degraded by motion, which could obscure subtle findings. Postcontrast sagittal imaging is nondiagnostic due to motion. No infarct, hemorrhage, hydrocephalus, or collection. Age normal brain volume Vascular: Major flow voids and vascular enhancements are preserved Skull and upper cervical spine: Negative for marrow lesion Sinuses/Orbits: Left cataract resection.  Negative for mass. IMPRESSION: No evidence of metastatic disease. Moderately motion degraded study due to claustrophobia Electronically Signed   By: Monte Fantasia M.D.   On: 01/12/2019 11:35   Ct Abdomen Pelvis W Contrast  Result Date: 01/07/2019 CLINICAL DATA:  63 year old male with history of 6 weeks  of worsening dyspnea and right-sided chest pain. Non-small cell carcinoma diagnosed recently by thoracentesis. Hypervascular lesion noted in the liver on recent CT examination. EXAM: CT ABDOMEN AND PELVIS WITH CONTRAST TECHNIQUE: Multidetector CT imaging of the abdomen and pelvis was performed using the standard protocol following bolus administration of intravenous contrast. CONTRAST:  126mL OMNIPAQUE IOHEXOL 300 MG/ML  SOLN COMPARISON:  No priors. FINDINGS: Lower chest: Unchanged compared with yesterday's CT examination (see that study for further details). Hepatobiliary: In segment 3 of the liver there is a 1.6 x 1.4 cm  hypervascular lesion on portal venous phase imaging which normalizes to surrounding background hepatic parenchyma and blood pool on delayed imaging, incompletely characterize, but strongly favored to represent a flash fill cavernous hemangioma. No other suspicious hepatic lesions are noted. No intra or extrahepatic biliary ductal dilatation. Gallbladder is unremarkable in appearance. Pancreas: No pancreatic mass. No pancreatic ductal dilatation. Mild haziness in peripancreatic fat, particularly adjacent to the inferior aspect of the pancreas. No well-defined peripancreatic fluid collections. Spleen: Unremarkable. Adrenals/Urinary Tract: Low-attenuation lesions in the upper pole the left kidney measuring up to 2.8 cm in diameter, compatible with simple cysts. Right kidney and bilateral adrenal glands are normal in appearance. No hydroureteronephrosis. Urinary bladder is normal in appearance. Stomach/Bowel: Normal appearance of the stomach. No pathologic dilatation of small bowel or colon. Numerous colonic diverticuli are noted, without surrounding inflammatory changes to suggest an acute diverticulitis at this time. Normal appendix. Vascular/Lymphatic: Aortic atherosclerosis, without evidence of aneurysm or dissection in the abdominal or pelvic vasculature. No lymphadenopathy noted in the abdomen or pelvis. Reproductive: Prostate gland and seminal vesicles are unremarkable in appearance. Other: No significant volume of ascites.  No pneumoperitoneum. Musculoskeletal: There are no aggressive appearing lytic or blastic lesions noted in the visualized portions of the skeleton. IMPRESSION: 1. No definite findings in the abdomen or pelvis to strongly suggest the presence of metastatic disease. 2. 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 This could be definitively characterized with  MRI of the abdomen with and without IV gadolinium if of clinical concern, or could simply be followed on routine follow-up examinations. 3. Haziness in the root of the small bowel mesentery inferior to the pancreas. This can sometimes be seen and can be benign. Alternatively, this could be seen in the setting of mild acute pancreatitis. Correlation with lipase levels is suggested. 4. Aortic atherosclerosis. Electronically Signed   By: Vinnie Langton M.D.   On: 01/07/2019 14:08   Dg Chest Port 1 View  Result Date: 01/10/2019 CLINICAL DATA:  63 year old male with history of pleural effusion. EXAM: PORTABLE CHEST 1 VIEW COMPARISON:  Chest x-ray 01/09/2019. FINDINGS: Right-sided chest tube in position with tip projecting over the lower right hemithorax. Moderate right pleural effusion again noted. No appreciable right pneumothorax confidently identified on today's examination. Irregular opacities throughout the right mid to lower lung may reflect areas of atelectasis and/or consolidation. A few scattered ill-defined reticulonodular opacities are noted throughout the left mid to lower lung, corresponding to scattered areas of nodularity seen on prior CT the abdomen and pelvis 01/07/2019. Left lung is otherwise clear. No left pleural effusion. No evidence of pulmonary edema. Heart size is normal. IMPRESSION: 1. Read chemo elation of right-sided pleural fluid. Moderate pleural effusion noted at this time. No residual pneumothorax confidently identified. 2. Patchy ill-defined opacities throughout the lungs bilaterally (right greater than left). Based on  correlation with prior chest CT, findings are favored to reflect metastatic disease with potential lymphangitic spread of tumor in the right lung. Underlying infectious consolidation is not entirely excluded, but is not strongly favored. Electronically Signed   By: Vinnie Langton M.D.   On: 01/10/2019 09:41   Dg Chest Port 1 View  Result Date: 01/09/2019 CLINICAL  DATA:  Lung cancer, post VATS EXAM: PORTABLE CHEST 1 VIEW COMPARISON:  Chest radiograph from one day prior. FINDINGS: Right internal jugular central venous catheter terminates at the cavoatrial junction. Stable cardiomediastinal silhouette with normal heart size. Right basilar chest tube in place. Stable small basilar right pneumothorax. Falling loss in the right hemithorax is unchanged. Diffuse right pleural thickening is stable. Patchy right lung base consolidation is unchanged. Multiple small scattered vaguely nodular opacities in the left lung. IMPRESSION: 1. Small right basilar pneumothorax with right chest tube in place, stable. 2. Stable diffuse right pleural thickening with volume loss in the right hemithorax. Stable patchy right lung base consolidation. 3. Stable scattered small vaguely nodular opacities in the left lung, see 01/06/2019 chest CT report for details. Electronically Signed   By: Ilona Sorrel M.D.   On: 01/09/2019 09:54   Dg Chest Port 1 View  Result Date: 01/08/2019 CLINICAL DATA:  Post right VATS. EXAM: PORTABLE CHEST 1 VIEW COMPARISON:  CT 01/06/2019. FINDINGS: Right IJ line noted with tip over superior vena cava. Right chest tube noted with tip over right lower chest. Right base pneumothorax noted. Right base atelectasis. Multiple pulmonary nodules are again noted. Heart size stable no acute bony abnormality. IMPRESSION: 1. Right chest tube noted with tip over right lower chest. right base pneumothorax noted on today's exam. 2.  Right IJ line noted with tip over superior vena cava. 3. Right base atelectasis. Multiple pulmonary nodules are again noted. Critical Value/emergent results were called by telephone at the time of interpretation on 01/08/2019 at 10:08 am to nurse Sharyn Lull, who verbally acknowledged these results. Electronically Signed   By: Marcello Moores  Register   On: 01/08/2019 10:11

## 2019-01-19 ENCOUNTER — Other Ambulatory Visit: Payer: Self-pay | Admitting: Hematology

## 2019-01-19 ENCOUNTER — Telehealth: Payer: Self-pay | Admitting: *Deleted

## 2019-01-19 ENCOUNTER — Ambulatory Visit: Payer: Self-pay | Admitting: Hematology

## 2019-01-19 ENCOUNTER — Other Ambulatory Visit: Payer: Self-pay

## 2019-01-19 DIAGNOSIS — C3490 Malignant neoplasm of unspecified part of unspecified bronchus or lung: Secondary | ICD-10-CM

## 2019-01-19 MED ORDER — MORPHINE SULFATE 15 MG PO TABS: 15.0000 mg | ORAL_TABLET | Freq: Four times a day (QID) | ORAL | 0 refills | Status: DC | PRN

## 2019-01-19 MED ORDER — PROCHLORPERAZINE MALEATE 10 MG PO TABS: 10.0000 mg | ORAL_TABLET | Freq: Four times a day (QID) | ORAL | 5 refills | Status: AC | PRN

## 2019-01-19 MED ORDER — MORPHINE SULFATE 15 MG PO TABS: 15.0000 mg | ORAL_TABLET | Freq: Four times a day (QID) | ORAL | 0 refills | Status: AC | PRN

## 2019-01-19 NOTE — Telephone Encounter (Signed)
Called pt spoke with his sister. Pt having nausea, pain medication is not helping. Pt sleeps in recliner due to difficulty breathing and chest tube. Discussed with sister and pt on speaker phone, MD will send in Rx for additional nausea medicine and Morphine 15mg  q6hrs. Pt is not to take the Vicodin with the morphine.  Sister and pt verbalized understanding. Encouraged small meals, chicken noodle soup will help with the nausea, take nausea meds wait about 30-4- minutes then try a small meal.  Sister advised pt was put on Hosp[facility] and [employer] has a PCP, they are awaiting a call back to start this process. No further concerns at this time.

## 2019-01-22 ENCOUNTER — Telehealth: Payer: Self-pay | Admitting: *Deleted

## 2019-01-22 NOTE — Telephone Encounter (Signed)
Called pt Pharmacy regarding MSO4 Rx. Pharmacy had 40 tablets ( Rx written for 60) ran through insurance and dispensed to pt.  Pt will need new Rx from MD on 8/28

## 2019-01-27 ENCOUNTER — Encounter: Payer: Self-pay | Admitting: Hematology

## 2019-01-27 ENCOUNTER — Inpatient Hospital Stay (HOSPITAL_BASED_OUTPATIENT_CLINIC_OR_DEPARTMENT_OTHER): Payer: No Typology Code available for payment source | Admitting: Hematology

## 2019-01-27 ENCOUNTER — Inpatient Hospital Stay: Payer: No Typology Code available for payment source | Attending: Hematology

## 2019-01-27 ENCOUNTER — Other Ambulatory Visit: Payer: Self-pay

## 2019-01-27 VITALS — BP 114/77 | HR 100 | Temp 99.0°F | Resp 18 | Ht 73.0 in | Wt 241.3 lb

## 2019-01-27 DIAGNOSIS — C3491 Malignant neoplasm of unspecified part of right bronchus or lung: Secondary | ICD-10-CM | POA: Insufficient documentation

## 2019-01-27 DIAGNOSIS — D72829 Elevated white blood cell count, unspecified: Secondary | ICD-10-CM | POA: Diagnosis not present

## 2019-01-27 DIAGNOSIS — E079 Disorder of thyroid, unspecified: Secondary | ICD-10-CM | POA: Insufficient documentation

## 2019-01-27 DIAGNOSIS — R9389 Abnormal findings on diagnostic imaging of other specified body structures: Secondary | ICD-10-CM | POA: Insufficient documentation

## 2019-01-27 DIAGNOSIS — K529 Noninfective gastroenteritis and colitis, unspecified: Secondary | ICD-10-CM | POA: Diagnosis not present

## 2019-01-27 DIAGNOSIS — D649 Anemia, unspecified: Secondary | ICD-10-CM | POA: Insufficient documentation

## 2019-01-27 DIAGNOSIS — J9811 Atelectasis: Secondary | ICD-10-CM | POA: Insufficient documentation

## 2019-01-27 DIAGNOSIS — R7989 Other specified abnormal findings of blood chemistry: Secondary | ICD-10-CM | POA: Diagnosis not present

## 2019-01-27 DIAGNOSIS — D473 Essential (hemorrhagic) thrombocythemia: Secondary | ICD-10-CM

## 2019-01-27 DIAGNOSIS — K859 Acute pancreatitis without necrosis or infection, unspecified: Secondary | ICD-10-CM | POA: Insufficient documentation

## 2019-01-27 DIAGNOSIS — D1803 Hemangioma of intra-abdominal structures: Secondary | ICD-10-CM | POA: Diagnosis not present

## 2019-01-27 DIAGNOSIS — I700 Atherosclerosis of aorta: Secondary | ICD-10-CM | POA: Insufficient documentation

## 2019-01-27 DIAGNOSIS — D75839 Thrombocytosis, unspecified: Secondary | ICD-10-CM | POA: Insufficient documentation

## 2019-01-27 DIAGNOSIS — M7989 Other specified soft tissue disorders: Secondary | ICD-10-CM | POA: Diagnosis not present

## 2019-01-27 DIAGNOSIS — I719 Aortic aneurysm of unspecified site, without rupture: Secondary | ICD-10-CM | POA: Diagnosis not present

## 2019-01-27 DIAGNOSIS — R918 Other nonspecific abnormal finding of lung field: Secondary | ICD-10-CM | POA: Insufficient documentation

## 2019-01-27 DIAGNOSIS — K769 Liver disease, unspecified: Secondary | ICD-10-CM | POA: Insufficient documentation

## 2019-01-27 DIAGNOSIS — Z7189 Other specified counseling: Secondary | ICD-10-CM

## 2019-01-27 DIAGNOSIS — J910 Malignant pleural effusion: Secondary | ICD-10-CM

## 2019-01-27 DIAGNOSIS — G893 Neoplasm related pain (acute) (chronic): Secondary | ICD-10-CM | POA: Diagnosis not present

## 2019-01-27 DIAGNOSIS — C3490 Malignant neoplasm of unspecified part of unspecified bronchus or lung: Secondary | ICD-10-CM

## 2019-01-27 DIAGNOSIS — Z79899 Other long term (current) drug therapy: Secondary | ICD-10-CM | POA: Diagnosis not present

## 2019-01-27 LAB — CMP (CANCER CENTER ONLY)
ALT: 21 U/L (ref 0–44)
AST: 18 U/L (ref 15–41)
Albumin: 3.3 g/dL — ABNORMAL LOW (ref 3.5–5.0)
Alkaline Phosphatase: 109 U/L (ref 38–126)
Anion gap: 13 (ref 5–15)
BUN: 10 mg/dL (ref 6–20)
CO2: 26 mmol/L (ref 22–32)
Calcium: 9.4 mg/dL (ref 8.9–10.3)
Chloride: 95 mmol/L — ABNORMAL LOW (ref 98–111)
Creatinine: 0.86 mg/dL (ref 0.61–1.24)
GFR, Est AFR Am: >60 mL/min (ref >60)
GFR, Estimated: >60 mL/min (ref >60)
Glucose, Bld: 116 mg/dL — ABNORMAL HIGH (ref 70–99)
Potassium: 4.1 mmol/L (ref 3.5–5.1)
Sodium: 134 mmol/L — ABNORMAL LOW (ref 135–145)
Total Bilirubin: 0.4 mg/dL (ref 0.3–1.2)
Total Protein: 7 g/dL (ref 6.5–8.1)

## 2019-01-27 LAB — CBC WITH DIFFERENTIAL (CANCER CENTER ONLY)
Abs Immature Granulocytes: 0.11 10*3/uL — ABNORMAL HIGH (ref 0.00–0.07)
Basophils Absolute: 0 10*3/uL (ref 0.0–0.1)
Basophils Relative: 0 %
Eosinophils Absolute: 0.3 10*3/uL (ref 0.0–0.5)
Eosinophils Relative: 3 %
HCT: 34.7 % — ABNORMAL LOW (ref 39.0–52.0)
Hemoglobin: 11 g/dL — ABNORMAL LOW (ref 13.0–17.0)
Immature Granulocytes: 1 %
Lymphocytes Relative: 20 %
Lymphs Abs: 2.1 10*3/uL (ref 0.7–4.0)
MCH: 26.9 pg (ref 26.0–34.0)
MCHC: 31.7 g/dL (ref 30.0–36.0)
MCV: 84.8 fL (ref 80.0–100.0)
Monocytes Absolute: 1.1 10*3/uL — ABNORMAL HIGH (ref 0.1–1.0)
Monocytes Relative: 10 %
Neutro Abs: 7.3 10*3/uL (ref 1.7–7.7)
Neutrophils Relative %: 66 %
Platelet Count: 419 10*3/uL — ABNORMAL HIGH (ref 150–400)
RBC: 4.09 MIL/uL — ABNORMAL LOW (ref 4.22–5.81)
RDW: 15.1 % (ref 11.5–15.5)
WBC Count: 11 10*3/uL — ABNORMAL HIGH (ref 4.0–10.5)
nRBC: 0 % (ref 0.0–0.2)

## 2019-01-27 LAB — CEA (IN HOUSE-CHCC): CEA (CHCC-In House): 17.3 ng/mL — ABNORMAL HIGH (ref 0.00–5.00)

## 2019-01-27 LAB — LACTATE DEHYDROGENASE: LDH: 255 U/L — ABNORMAL HIGH (ref 98–192)

## 2019-01-27 MED ORDER — MORPHINE SULFATE ER 15 MG PO TBCR: 15.0000 mg | EXTENDED_RELEASE_TABLET | Freq: Two times a day (BID) | ORAL | 0 refills | Status: DC

## 2019-01-27 NOTE — Patient Instructions (Signed)
Carboplatin injection What is this medicine? CARBOPLATIN (KAR boe pla tin) is a chemotherapy drug. It targets fast dividing cells, like cancer cells, and causes these cells to die. This medicine is used to treat ovarian cancer and many other cancers. This medicine may be used for other purposes; ask your health care provider or pharmacist if you have questions. COMMON BRAND NAME(S): Paraplatin What should I tell my health care provider before I take this medicine? They need to know if you have any of these conditions:  blood disorders  hearing problems  kidney disease  recent or ongoing radiation therapy  an unusual or allergic reaction to carboplatin, cisplatin, other chemotherapy, other medicines, foods, dyes, or preservatives  pregnant or trying to get pregnant  breast-feeding How should I use this medicine? This drug is usually given as an infusion into a vein. It is administered in a hospital or clinic by a specially trained health care professional. Talk to your pediatrician regarding the use of this medicine in children. Special care may be needed. Overdosage: If you think you have taken too much of this medicine contact a poison control center or emergency room at once. NOTE: This medicine is only for you. Do not share this medicine with others. What if I miss a dose? It is important not to miss a dose. Call your doctor or health care professional if you are unable to keep an appointment. What may interact with this medicine?  medicines for seizures  medicines to increase blood counts like filgrastim, pegfilgrastim, sargramostim  some antibiotics like amikacin, gentamicin, neomycin, streptomycin, tobramycin  vaccines Talk to your doctor or health care professional before taking any of these medicines:  acetaminophen  aspirin  ibuprofen  ketoprofen  naproxen This list may not describe all possible interactions. Give your health care provider a list of all the  medicines, herbs, non-prescription drugs, or dietary supplements you use. Also tell them if you smoke, drink alcohol, or use illegal drugs. Some items may interact with your medicine. What should I watch for while using this medicine? Your condition will be monitored carefully while you are receiving this medicine. You will need important blood work done while you are taking this medicine. This drug may make you feel generally unwell. This is not uncommon, as chemotherapy can affect healthy cells as well as cancer cells. Report any side effects. Continue your course of treatment even though you feel ill unless your doctor tells you to stop. In some cases, you may be given additional medicines to help with side effects. Follow all directions for their use. Call your doctor or health care professional for advice if you get a fever, chills or sore throat, or other symptoms of a cold or flu. Do not treat yourself. This drug decreases your body's ability to fight infections. Try to avoid being around people who are sick. This medicine may increase your risk to bruise or bleed. Call your doctor or health care professional if you notice any unusual bleeding. Be careful brushing and flossing your teeth or using a toothpick because you may get an infection or bleed more easily. If you have any dental work done, tell your dentist you are receiving this medicine. Avoid taking products that contain aspirin, acetaminophen, ibuprofen, naproxen, or ketoprofen unless instructed by your doctor. These medicines may hide a fever. Do not become pregnant while taking this medicine. Women should inform their doctor if they wish to become pregnant or think they might be pregnant. There is a  potential for serious side effects to an unborn child. Talk to your health care professional or pharmacist for more information. Do not breast-feed an infant while taking this medicine. What side effects may I notice from receiving this  medicine? Side effects that you should report to your doctor or health care professional as soon as possible:  allergic reactions like skin rash, itching or hives, swelling of the face, lips, or tongue  signs of infection - fever or chills, cough, sore throat, pain or difficulty passing urine  signs of decreased platelets or bleeding - bruising, pinpoint red spots on the skin, black, tarry stools, nosebleeds  signs of decreased red blood cells - unusually weak or tired, fainting spells, lightheadedness  breathing problems  changes in hearing  changes in vision  chest pain  high blood pressure  low blood counts - This drug may decrease the number of white blood cells, red blood cells and platelets. You may be at increased risk for infections and bleeding.  nausea and vomiting  pain, swelling, redness or irritation at the injection site  pain, tingling, numbness in the hands or feet  problems with balance, talking, walking  trouble passing urine or change in the amount of urine Side effects that usually do not require medical attention (report to your doctor or health care professional if they continue or are bothersome):  hair loss  loss of appetite  metallic taste in the mouth or changes in taste This list may not describe all possible side effects. Call your doctor for medical advice about side effects. You may report side effects to FDA at 1-800-FDA-1088. Where should I keep my medicine? This drug is given in a hospital or clinic and will not be stored at home. NOTE: This sheet is a summary. It may not cover all possible information. If you have questions about this medicine, talk to your doctor, pharmacist, or health care provider.  2020 [employer] Standard (2007-08-25 14:38:05)  Pemetrexed injection What is this medicine? PEMETREXED (PEM e TREX ed) is a chemotherapy drug used to treat lung cancers like non-small cell lung cancer and mesothelioma. It may also be  used to treat other cancers. This medicine may be used for other purposes; ask your health care provider or pharmacist if you have questions. COMMON BRAND NAME(S): Alimta What should I tell my health care provider before I take this medicine? They need to know if you have any of these conditions:  infection (especially a virus infection such as chickenpox, cold sores, or herpes)  kidney disease  low blood counts, like low white cell, platelet, or red cell counts  lung or breathing disease, like asthma  radiation therapy  an unusual or allergic reaction to pemetrexed, other medicines, foods, dyes, or preservative  pregnant or trying to get pregnant  breast-feeding How should I use this medicine? This drug is given as an infusion into a vein. It is administered in a hospital or clinic by a specially trained health care professional. Talk to your pediatrician regarding the use of this medicine in children. Special care may be needed. Overdosage: If you think you have taken too much of this medicine contact a poison control center or emergency room at once. NOTE: This medicine is only for you. Do not share this medicine with others. What if I miss a dose? It is important not to miss your dose. Call your doctor or health care professional if you are unable to keep an appointment. What may interact with this  medicine? This medicine may interact with the following medications:  Ibuprofen This list may not describe all possible interactions. Give your health care provider a list of all the medicines, herbs, non-prescription drugs, or dietary supplements you use. Also tell them if you smoke, drink alcohol, or use illegal drugs. Some items may interact with your medicine. What should I watch for while using this medicine? Visit your doctor for checks on your progress. This drug may make you feel generally unwell. This is not uncommon, as chemotherapy can affect healthy cells as well as cancer  cells. Report any side effects. Continue your course of treatment even though you feel ill unless your doctor tells you to stop. In some cases, you may be given additional medicines to help with side effects. Follow all directions for their use. Call your doctor or health care professional for advice if you get a fever, chills or sore throat, or other symptoms of a cold or flu. Do not treat yourself. This drug decreases your body's ability to fight infections. Try to avoid being around people who are sick. This medicine may increase your risk to bruise or bleed. Call your doctor or health care professional if you notice any unusual bleeding. Be careful brushing and flossing your teeth or using a toothpick because you may get an infection or bleed more easily. If you have any dental work done, tell your dentist you are receiving this medicine. Avoid taking products that contain aspirin, acetaminophen, ibuprofen, naproxen, or ketoprofen unless instructed by your doctor. These medicines may hide a fever. Call your doctor or health care professional if you get diarrhea or mouth sores. Do not treat yourself. To protect your kidneys, drink water or other fluids as directed while you are taking this medicine. Do not become pregnant while taking this medicine or for 6 months after stopping it. Women should inform their doctor if they wish to become pregnant or think they might be pregnant. Men should not father a child while taking this medicine and for 3 months after stopping it. This may interfere with the ability to father a child. You should talk to your doctor or health care professional if you are concerned about your fertility. There is a potential for serious side effects to an unborn child. Talk to your health care professional or pharmacist for more information. Do not breast-feed an infant while taking this medicine or for 1 week after stopping it. What side effects may I notice from receiving this  medicine? Side effects that you should report to your doctor or health care professional as soon as possible:  allergic reactions like skin rash, itching or hives, swelling of the face, lips, or tongue  breathing problems  redness, blistering, peeling or loosening of the skin, including inside the mouth  signs and symptoms of bleeding such as bloody or black, tarry stools; red or dark-brown urine; spitting up blood or brown material that looks like coffee grounds; red spots on the skin; unusual bruising or bleeding from the eye, gums, or nose  signs and symptoms of infection like fever or chills; cough; sore throat; pain or trouble passing urine  signs and symptoms of kidney injury like trouble passing urine or change in the amount of urine  signs and symptoms of liver injury like dark yellow or brown urine; general ill feeling or flu-like symptoms; light-colored stools; loss of appetite; nausea; right upper belly pain; unusually weak or tired; yellowing of the eyes or skin Side effects that usually  do not require medical attention (report to your doctor or health care professional if they continue or are bothersome):  constipation  mouth sores  nausea, vomiting  unusually weak or tired This list may not describe all possible side effects. Call your doctor for medical advice about side effects. You may report side effects to FDA at 1-800-FDA-1088. Where should I keep my medicine? This drug is given in a hospital or clinic and will not be stored at home. NOTE: This sheet is a summary. It may not cover all possible information. If you have questions about this medicine, talk to your doctor, pharmacist, or health care provider.  2020 [employer]/Gold Standard (2017-07-09 16:11:33)  Pembrolizumab injection What is this medicine? PEMBROLIZUMAB (pem broe liz ue mab) is a monoclonal antibody. It is used to treat bladder cancer, cervical cancer, endometrial cancer, esophageal cancer, head and  neck cancer, hepatocellular cancer, Hodgkin lymphoma, kidney cancer, lymphoma, melanoma, Merkel cell carcinoma, lung cancer, stomach cancer, urothelial cancer, and cancers that have a certain genetic condition. This medicine may be used for other purposes; ask your health care provider or pharmacist if you have questions. COMMON BRAND NAME(S): Keytruda What should I tell my health care provider before I take this medicine? They need to know if you have any of these conditions:  diabetes  immune system problems  inflammatory bowel disease  liver disease  lung or breathing disease  lupus  received or scheduled to receive an organ transplant or a stem-cell transplant that uses donor stem cells  an unusual or allergic reaction to pembrolizumab, other medicines, foods, dyes, or preservatives  pregnant or trying to get pregnant  breast-feeding How should I use this medicine? This medicine is for infusion into a vein. It is given by a health care professional in a hospital or clinic setting. A special MedGuide will be given to you before each treatment. Be sure to read this information carefully each time. Talk to your pediatrician regarding the use of this medicine in children. While this drug may be prescribed for selected conditions, precautions do apply. Overdosage: If you think you have taken too much of this medicine contact a poison control center or emergency room at once. NOTE: This medicine is only for you. Do not share this medicine with others. What if I miss a dose? It is important not to miss your dose. Call your doctor or health care professional if you are unable to keep an appointment. What may interact with this medicine? Interactions have not been studied. Give your health care provider a list of all the medicines, herbs, non-prescription drugs, or dietary supplements you use. Also tell them if you smoke, drink alcohol, or use illegal drugs. Some items may interact with  your medicine. This list may not describe all possible interactions. Give your health care provider a list of all the medicines, herbs, non-prescription drugs, or dietary supplements you use. Also tell them if you smoke, drink alcohol, or use illegal drugs. Some items may interact with your medicine. What should I watch for while using this medicine? Your condition will be monitored carefully while you are receiving this medicine. You may need blood work done while you are taking this medicine. Do not become pregnant while taking this medicine or for 4 months after stopping it. Women should inform their doctor if they wish to become pregnant or think they might be pregnant. There is a potential for serious side effects to an unborn child. Talk to your health care professional  or pharmacist for more information. Do not breast-feed an infant while taking this medicine or for 4 months after the last dose. What side effects may I notice from receiving this medicine? Side effects that you should report to your doctor or health care professional as soon as possible:  allergic reactions like skin rash, itching or hives, swelling of the face, lips, or tongue  bloody or black, tarry  breathing problems  changes in vision  chest pain  chills  confusion  constipation  cough  diarrhea  dizziness or feeling faint or lightheaded  fast or irregular heartbeat  fever  flushing  hair loss  joint pain  low blood counts - this medicine may decrease the number of white blood cells, red blood cells and platelets. You may be at increased risk for infections and bleeding.  muscle pain  muscle weakness  persistent headache  redness, blistering, peeling or loosening of the skin, including inside the mouth  signs and symptoms of high blood sugar such as dizziness; dry mouth; dry skin; fruity breath; nausea; stomach pain; increased hunger or thirst; increased urination  signs and symptoms of  kidney injury like trouble passing urine or change in the amount of urine  signs and symptoms of liver injury like dark urine, light-colored stools, loss of appetite, nausea, right upper belly pain, yellowing of the eyes or skin  sweating  swollen lymph nodes  weight loss Side effects that usually do not require medical attention (report to your doctor or health care professional if they continue or are bothersome):  decreased appetite  muscle pain  tiredness This list may not describe all possible side effects. Call your doctor for medical advice about side effects. You may report side effects to FDA at 1-800-FDA-1088. Where should I keep my medicine? This drug is given in a hospital or clinic and will not be stored at home. NOTE: This sheet is a summary. It may not cover all possible information. If you have questions about this medicine, talk to your doctor, pharmacist, or health care provider.  2020 [employer]/Gold Standard (2018-06-16 13:46:58)

## 2019-01-27 NOTE — Progress Notes (Signed)
[name] OFFICE PROGRESS NOTE  Patient Care Team: Patient, No Pcp Per as PCP - General (General Practice)  HEME/ONC OVERVIEW: 1. 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 Malignant right pleural effusion s/p PleurX at 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  I discussed with the patient that given his relatively good health, he is a good candidate for palliative treatment, but he cannot be on hospice and receive palliative treatment at the same time. -We reviewed NCCN guidelines detail and some of the treatment options if he decides to come off hospice. -Based on the PD-L1 of 10%, I would propose the regimen of carboplatin, pemetrexed and pembrolizumab x 4 cycles, followed by maintenance pembrolizumab +/-pemetrexed until disease progression or unacceptable toxicities. -We discussed some of the risks, benefits, side-effects of the above treatment regimen -Some of the short term side-effects included, though not limited to, including fatigue, weight loss, dyspnea, cough, musculoskeletal pain, life threatening infections, risk of allergic reactions, need for transfusions of blood products, nausea, vomiting, change in  bowel habits, loss of hair, admission to hospital for various reasons, and risks of death. Clinically significant immune-related adverse events for patients receiving immunotherapy included pneumonitis, hepatitis, colitis, and thyroid disease. -Long term side-effects are also discussed including risks of permanent damage to nerve function, hearing loss, chronic fatigue, kidney damage with possibility needing hemodialysis, and rare secondary malignancy including bone marrow disorders. -The patient is aware that the response rates discussed earlier is not guaranteed.   -After lengthy discussions, patient was uncertain regarding whether he would like to pursue treatment, and would like to discuss with his family before making a decision -I provided the patient with the contact information to the clinic, and recommended him to contact us as soon as possible if he decides to pursue treatment -I also explained to the patient that if he chooses to pursue treatment, he would need port placement -Pending his decision, we will decide the next step of plan  Malignant right pleural effusion -I discussed with the patient that he had some trapped lung on recent VATS, and will likely have recurrent fluid accumulation due to incomplete expansion of the lung -Currently draining approximately 360m per day  -Continue daily to q2day drainage per home hospice -If he decides to come off hospice and receive treatment, we will place home health referral for the management of Pleurx catheter and supplies  Cancer-related pain -Secondary to Pleurx catheter and pleural involvement by lung cancer -Currently on IR morphine [id number] PRN with breakthrough pain -I have prescribed MS-Contin 1556mID for long-acting pain control -I also instructed the patient to reduce his NSAID use to no more than 400 mg BID PRN due to the risk of NSAID-induced ulcers -As he is currently on hospice, I recommend the patient to discuss with his  hospice physician  further regarding pain management  Normocytic anemia -Most likely secondary to anemia of chronic disease -Hgb 11.0, stable  -Patient denies any symptoms of bleeding -Continue to monitor it for now  Leukocytosis -Likely reactive in the setting of underlying malignancy -WBC 11.0k today, stable  -Patient denies any symptoms of infection -We will monitor it for now  Thrombocytosis -Likely reactive in the setting of underlying malignancy -Plts 419k today, stable  -We will monitor it for now   Goals of care discussion -As discussed above, patient just started home hospice this week  -I reinforced with the patient that the goal of treatment is for palliative intent only, not curative  -Patient expressed understanding, and would like to discuss with his family further before making a decision  Orders Placed This Encounter  Procedures  . IR IMAGING GUIDED PORT INSERTION    Standing Status:   Future    Standing Expiration Date:   03/28/2020    Order Specific Question:   Reason for Exam (SYMPTOM  OR DIAGNOSIS REQUIRED)    Answer:   Metastatic lung cancer, need chemo access    Order Specific Question:   Preferred Imaging Location?    Answer:   [facility]   All questions were answered. The patient knows to call the clinic with any problems, questions or concerns. No barriers to learning was detected.  A total of more than 40 minutes were spent face-to-face with the patient during this encounter and over half of that time was spent on counseling and coordination of care as outlined above.   Return to be determined, pending the patient's decision on treatment.   Tish Men, MD 01/27/2019 3:45 PM  CHIEF COMPLAINT: "I just don't want to be a burden to my family"  INTERVAL HISTORY: Andrew. Davenport returns to clinic for follow-up of metastatic adenocarcinoma of the lung.  Patient reports that he has been draining his Pleurx daily with approximately 352m output  per day.  He has been pain near the catheter site, as well as pleuritic chest pain with deep inspiration.  He is currently taking IR morphine 15 mg every 6 hours, but the effect of pain relief seems to last only 3 to 4 hours.  He is also taking ibuprofen 600 mg every 4 hours per instruction from the family medicine team during his recent hospitalization.  He is drinking adequate fluids, but has very limited appetite.  He has some intermittent nausea, for which anti-emetic medications help.  He had a difficult time with [facility], [facility]  He denies any other complaint.  SUMMARY OF ONCOLOGIC HISTORY: Oncology History  Adenocarcinoma of lung ([facility]   Initial Diagnosis   Malignant neoplasm of lung ([facility]   12/20/2018 Imaging   CTA chest: IMPRESSION: 1.  Large unilateral right pleural effusion, possibly malignant. Thoracentesis with cytology should be considered for further assessment.  2.  Near complete collapse/compressive atelectasis of the right lung. 3.  Bilateral nodules may be infectious or related to metastatic disease. 4.  Mediastinal shift to the left as a result of the large right pleural effusion.  5.  No central pulmonary embolism identified. However, evaluation is limited. 6.  Indeterminate hypervascular focus in the peripheral left hepatic lobe may relate to flash fill hemangioma.  7.  Probable left renal cysts.   12/26/2018 Imaging   CT abdomen/pelvis: IMPRESSION: 1.  Moderately large right pleural effusion with compressive right lung atelectasis. The right pleural effusion appears  slightly smaller. 2.  Numerous bilateral ill-defined lung nodules without apparent change 3.   Gallstone versus gallbladder mass. Recommend gallbladder ultrasound for further evaluation   12/30/2018 Imaging   CT chest w/o contrast: IMPRESSION: A right-sided pleural drain is now present. The moderately large right pleural effusion and diffuse  right pleural thickening have not significantly changed. The adjacent right lung consolidation/ atelectasis is similar. The multiple bilateral solid and  groundglass pulmonary nodules are unchanged.    12/31/2018 Pathology Results   Right Pleural Fluid (ThinPrep, cell block):  Scattered malignant cells present, see comment. Marked acute inflammation and blood.  Cytotechnologist: Lorel Monaco, CT(ASCP)   01/07/2019 Imaging   CT abdomen/pelvis: IMPRESSION: 1. No definite findings in the abdomen or pelvis to strongly suggest the presence of metastatic disease. 2. 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 This could be definitively characterized with MRI of the abdomen with and without IV gadolinium if of clinical concern, or could simply be followed on routine follow-up examinations. 3. Haziness in the root of the small bowel mesentery inferior to the pancreas. This can sometimes be seen and can be benign. Alternatively, this could be seen in the setting of mild acute pancreatitis. Correlation with lipase levels is suggested. 4. Aortic atherosclerosis.   01/08/2019 Pathology Results   Accession: [id number] Diagnosis 1. Pleura, biopsy, Right - ADENOCARCINOMA. 2. Pleura, peel, Right - MALIGNANT CELLS CONSISTENT WITH NON-SMALL CELL CARCINOMA AND ABUNDANT FIBRINOUS EXUDATE. 3. Pleura, biopsy, Right - ADENOCARCINOMA.  ADDITIONAL INFORMATION: 1. The tumor is negative with immunohistochemistry for Napsin A and thyroid transcription factor-1. The differential therefore includes primary lung adenocarcinoma and metastatic adenocarcinoma.   01/12/2019 Imaging   MRI brain:   IMPRESSION: No evidence of metastatic disease.   Moderately motion degraded study due to claustrophobia   02/10/2019 -  Chemotherapy   The patient had palonosetron (ALOXI)  injection 0.25 mg, 0.25 mg, Intravenous,  Once, 0 of 4 cycles PEMEtrexed (ALIMTA) 1,175 mg in sodium chloride 0.9 % 100 mL chemo infusion, 500 mg/m2, Intravenous,  Once, 0 of 6 cycles CARBOplatin (PARAPLATIN) 750 mg in sodium chloride 0.9 % 250 mL chemo infusion, 750 mg (original dose ), Intravenous,  Once, 0 of 4 cycles Dose modification: 750 mg (Cycle 1) pembrolizumab (KEYTRUDA) 200 mg in sodium chloride 0.9 % 50 mL chemo infusion, 200 mg, Intravenous, Once, 0 of 6 cycles fosaprepitant (EMEND) 150 mg, dexamethasone (DECADRON) 12 mg in sodium chloride 0.9 % 145 mL IVPB, , Intravenous,  Once, 0 of 4 cycles  for chemotherapy treatment.      REVIEW OF SYSTEMS:   Constitutional: ( - ) fevers, ( - )  chills , ( - ) night sweats Eyes: ( - ) blurriness of vision, ( - ) double vision, ( - ) watery eyes Ears, nose, mouth, throat, and face: ( - ) mucositis, ( - ) sore throat Respiratory: ( - ) cough, ( + ) dyspnea, ( - ) wheezes Cardiovascular: ( - ) palpitation, ( - ) chest discomfort, ( + ) lower extremity swelling Gastrointestinal:  ( - ) nausea, ( - ) heartburn, ( - ) change in bowel habits Skin: ( - ) abnormal skin rashes Lymphatics: ( - ) new lymphadenopathy, ( - ) easy bruising Neurological: ( - ) numbness, ( - ) tingling, ( - ) new weaknesses Behavioral/Psych: ( - ) mood change, ( - ) new changes  All other systems were reviewed with the patient and are negative.  I have reviewed the past medical history, past surgical history, social history and family history with the patient and they are unchanged from previous note.  ALLERGIES:  has No Known Allergies.  MEDICATIONS:  Current Outpatient Medications  Medication Sig Dispense Refill  . ibuprofen (ADVIL) 600 MG tablet Take 600 mg by mouth every 6 (six) hours as needed.    . metFORMIN (GLUCOPHAGE-XR) 750 MG 24 hr tablet Take 750 mg by mouth daily with breakfast.    . morphine (MS CONTIN) 15 MG 12 hr tablet Take 1 tablet (15 mg total) by  mouth every 12 (twelve) hours. 60 tablet 0  . morphine (MSIR) 15 MG tablet Take 1 tablet (15 mg total) by mouth every 6 (six) hours as needed for severe pain. 60 tablet 0  . Multiple Vitamin (MULTIVITAMIN WITH MINERALS) TABS tablet Take 1 tablet by mouth daily. 30 tablet 0  . pantoprazole (PROTONIX) 40 MG tablet Take 40 mg by mouth daily.    . polyethylene glycol (MIRALAX / GLYCOLAX) 17 g packet Take 17 g by mouth daily. 14 each 0  . prochlorperazine (COMPAZINE) 10 MG tablet Take 1 tablet (10 mg total) by mouth every 6 (six) hours as needed for nausea or vomiting. 30 tablet 5  . senna-docusate (SENOKOT-S) 8.6-50 MG tablet Take 1 tablet by mouth at bedtime. 30 tablet 0   No current facility-administered medications for this visit.     PHYSICAL EXAMINATION: ECOG PERFORMANCE STATUS: 2 - Symptomatic, <50% confined to bed  Today's Vitals   01/27/19 1343 01/27/19 1350  BP: 114/77   Pulse: 100   Resp: 18   Temp: 99 F (37.2 C)   TempSrc: Oral   SpO2: 100%   Weight: 241 lb 4.8 oz (109.5 kg)   Height: 6' 1"  (1.854 m)   PainSc:  6    Body mass index is 31.84 kg/m.  Filed Weights   01/27/19 1343  Weight: 241 lb 4.8 oz (109.5 kg)    GENERAL: alert, no distress and comfortable sitting in a wheelchair SKIN: skin color, texture, turgor are normal, no rashes or significant lesions EYES: conjunctiva are pink and non-injected, sclera clear OROPHARYNX: no exudate, no erythema; lips, buccal mucosa, and tongue normal  NECK: supple, non-tender LUNGS: decreased breath sounds in the right lung base, normal to auscultation the left lung, no crackles or rales HEART: regular rate & rhythm and no murmurs and 1+ bilateral pain.  Lower extremity edema ABDOMEN: soft, non-tender, non-distended, normal bowel sounds Musculoskeletal: no cyanosis of digits and no clubbing  PSYCH: alert & oriented x 3, fluent speech NEURO: no focal motor/sensory deficits  LABORATORY DATA:  I have reviewed the data as  listed    Component Value Date/Time   NA 134 (L) 01/27/2019 1321   K 4.1 01/27/2019 1321   CL 95 (L) 01/27/2019 1321   CO2 26 01/27/2019 1321   GLUCOSE 116 (H) 01/27/2019 1321   BUN 10 01/27/2019 1321   CREATININE 0.86 01/27/2019 1321   CALCIUM 9.4 01/27/2019 1321   PROT 7.0 01/27/2019 1321   ALBUMIN 3.3 (L) 01/27/2019 1321   AST 18 01/27/2019 1321   ALT 21 01/27/2019 1321   ALKPHOS 109 01/27/2019 1321   BILITOT 0.4 01/27/2019 1321   GFRNONAA >60 01/27/2019 1321   GFRAA >60 01/27/2019 1321    No results found for: SPEP, UPEP  Lab Results  Component Value Date   WBC 11.0 (H)  01/27/2019   NEUTROABS 7.3 01/27/2019   HGB 11.0 (L) 01/27/2019   HCT 34.7 (L) 01/27/2019   MCV 84.8 01/27/2019   PLT 419 (H) 01/27/2019      Chemistry      Component Value Date/Time   NA 134 (L) 01/27/2019 1321   K 4.1 01/27/2019 1321   CL 95 (L) 01/27/2019 1321   CO2 26 01/27/2019 1321   BUN 10 01/27/2019 1321   CREATININE 0.86 01/27/2019 1321      Component Value Date/Time   CALCIUM 9.4 01/27/2019 1321   ALKPHOS 109 01/27/2019 1321   AST 18 01/27/2019 1321   ALT 21 01/27/2019 1321   BILITOT 0.4 01/27/2019 1321       RADIOGRAPHIC STUDIES: I have personally reviewed the radiological images as listed below and agreed with the findings in the report. Dg Chest 2 View  Result Date: 01/06/2019 CLINICAL DATA:  Shortness of breath. Chest pain. Pleural effusion. Chest tube. EXAM: CHEST - 2 VIEW COMPARISON:  No recent prior. FINDINGS: Heart size normal. Right base atelectasis/infiltrate and right-sided pleural effusion. Right chest tube noted with tip over the right lower chest. No pneumothorax. Mild patchy infiltrates in the left lung cannot be excluded. Degenerative change thoracic spine. IMPRESSION: 1. Right base atelectasis/infiltrate right-sided pleural effusion. Right chest tube noted with tip over the right lower chest. No pneumothorax. 2. Mild patchy infiltrates in left lung cannot be  excluded. Electronically Signed   By: Marcello Moores  Register   On: 01/06/2019 09:27   Ct Chest W Contrast  Result Date: 01/06/2019 CLINICAL DATA:  Chest pain, shortness of breath, back pain and recent history of right pleural effusion with tunneled PleurX catheter placement at outside institution. Chest x-ray demonstrates component of right-sided pleural fluid and pulmonary airspace opacity. EXAM: CT CHEST WITH CONTRAST TECHNIQUE: Multidetector CT imaging of the chest was performed during intravenous contrast administration. CONTRAST:  59m OMNIPAQUE IOHEXOL 300 MG/ML  SOLN COMPARISON:  Chest x-ray earlier today. FINDINGS: Cardiovascular: The ascending thoracic aorta is mildly dilated and measures 4 cm in maximal measured diameter. The aortic root is normal in caliber and measures 3.4-3.6 cm. The proximal arch measures 3.8 cm and the distal arch 2.8 cm. The descending thoracic aorta measures 2.6 cm. No evidence of aortic dissection. Proximal great vessels are normally patent and demonstrate bovine branching anatomy. The pulmonary arteries are also well opacified and demonstrates normal caliber without visible pulmonary embolism. Mediastinum/Nodes: No evidence of mediastinal lymphadenopathy or mass. Mildly prominent right hilar lymph node measures approximately 1.5 cm. Unremarkable thyroid gland, of CT appearance of the esophagus and trachea. Lungs/Pleura: Tunneled pleural drainage catheter enters the lateral pleural space and ascends posteriorly. There is a small amount of loculated pleural fluid present in the pleural space with associated pleural thickening. Parenchymal atelectasis and scarring present with lack of complete expansion of the right lung. There are multiple bilateral ill-defined pulmonary nodules in both upper and lower lobes. These are scattered predominantly in a peripheral distribution and are all approximately 7 mm or less in size. These are not smoothly demarcated nodules and may be inflammatory in  nature. However, metastatic disease cannot be excluded and correlation suggested with any known diagnosis of malignancy. No pneumothorax, pulmonary edema or significant focal airspace consolidation is identified. Upper Abdomen: Focal hypervascular focus in a relatively arterial phase of imaging in the liver in segment III measures approximately 1.7 cm in greatest diameter. Differential considerations include hemangioma, perfusion anomaly or hypervascular mass. No other focal abnormalities are identified  in the visualized upper to mid liver. Musculoskeletal: No fractures or focal bony lesions identified. IMPRESSION: 1. Mildly dilated ascending thoracic aorta measures 4 cm in greatest diameter. Recommend annual imaging followup by CTA or MRA. This recommendation follows 2010 ACCF/AHA/AATS/ACR/ASA/SCA/SCAI/SIR/STS/SVM Guidelines for the Diagnosis and Management of Patients with Thoracic Aortic Disease. Circulation. 2010; 121: U882-C003. Aortic aneurysm NOS (ICD10-I71.9) 2. Mildly enlarged right hilar lymph node measures 1.5 cm in short axis. 3. Small amount of loculated right pleural fluid with associated pleural thickening and restricted expansion of the right lung. Indwelling PleurX catheter present. 4. Multiple ill-defined bilateral small pulmonary nodules all measuring under 1 cm. These may be inflammatory in nature based on appearance. However, metastatic disease is not excluded and correlation suggested with any known diagnosis of malignancy. 5. 1.7 cm hypervascular lesion in segment III of the liver with differential including hemangioma, perfusion anomaly or hypervascular mass. Correlation suggested with any prior abdominal CT or MRI imaging. Aortic aneurysm NOS (ICD10-I71.9). Electronically Signed   By: Aletta Edouard M.D.   On: 01/06/2019 14:19   Andrew Davenport KJ Contrast  Result Date: 01/12/2019 CLINICAL DATA:  Lung cancer staging EXAM: MRI HEAD WITHOUT AND WITH CONTRAST TECHNIQUE: Multiplanar, multiecho  pulse sequences of the brain and surrounding structures were obtained without and with intravenous contrast. CONTRAST:  10 cc Gadavist intravenous COMPARISON:  None. FINDINGS: Brain: No evidence of enhancement or swelling to suggest metastatic disease. Axial and coronal postcontrast imaging is moderately degraded by motion, which could obscure subtle findings. Postcontrast sagittal imaging is nondiagnostic due to motion. No infarct, hemorrhage, hydrocephalus, or collection. Age normal brain volume Vascular: Major flow voids and vascular enhancements are preserved Skull and upper cervical spine: Negative for marrow lesion Sinuses/Orbits: Left cataract resection.  Negative for mass. IMPRESSION: No evidence of metastatic disease. Moderately motion degraded study due to claustrophobia Electronically Signed   By: Monte Fantasia M.D.   On: 01/12/2019 11:35   Ct Abdomen Pelvis W Contrast  Result Date: 01/07/2019 CLINICAL DATA:  63 year old male with history of 6 weeks of worsening dyspnea and right-sided chest pain. Non-small cell carcinoma diagnosed recently by thoracentesis. Hypervascular lesion noted in the liver on recent CT examination. EXAM: CT ABDOMEN AND PELVIS WITH CONTRAST TECHNIQUE: Multidetector CT imaging of the abdomen and pelvis was performed using the standard protocol following bolus administration of intravenous contrast. CONTRAST:  124m OMNIPAQUE IOHEXOL 300 MG/ML  SOLN COMPARISON:  No priors. FINDINGS: Lower chest: Unchanged compared with yesterday's CT examination (see that study for further details). 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 No other suspicious hepatic lesions are noted. No intra or extrahepatic biliary ductal  dilatation. Gallbladder is unremarkable in appearance. Pancreas: No pancreatic mass. No pancreatic ductal dilatation. Mild haziness in peripancreatic fat, particularly adjacent to the inferior aspect of the pancreas. No well-defined peripancreatic fluid collections. Spleen: Unremarkable. Adrenals/Urinary Tract: Low-attenuation lesions in the upper pole the left kidney measuring up to 2.8 cm in diameter, compatible with simple cysts. Right kidney and bilateral adrenal glands are normal in appearance. No hydroureteronephrosis. Urinary bladder is normal in appearance. Stomach/Bowel: Normal appearance of the stomach. No pathologic dilatation of small bowel or colon. Numerous colonic diverticuli are noted, without surrounding inflammatory changes to suggest an acute diverticulitis at this time. Normal appendix.  Vascular/Lymphatic: Aortic atherosclerosis, without evidence of aneurysm or dissection in the abdominal or pelvic vasculature. No lymphadenopathy noted in the abdomen or pelvis. Reproductive: Prostate gland and seminal vesicles are unremarkable in appearance. Other: No significant volume of ascites.  No pneumoperitoneum. Musculoskeletal: There are no aggressive appearing lytic or blastic lesions noted in the visualized portions of the skeleton. IMPRESSION: 1. No definite findings in the abdomen or pelvis to strongly suggest the presence of metastatic disease. 2. 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 This could be definitively characterized with MRI of the abdomen with and without IV gadolinium if of clinical concern, or could simply be followed on routine follow-up examinations. 3. Haziness in the root of the small bowel mesentery inferior to the pancreas. This can sometimes be seen and can be benign. Alternatively, this could be seen in the setting of mild acute  pancreatitis. Correlation with lipase levels is suggested. 4. Aortic atherosclerosis. Electronically Signed   By: Vinnie Langton M.D.   On: 01/07/2019 14:08   Dg Chest Port 1 View  Result Date: 01/10/2019 CLINICAL DATA:  63 year old male with history of pleural effusion. EXAM: PORTABLE CHEST 1 VIEW COMPARISON:  Chest x-ray 01/09/2019. FINDINGS: Right-sided chest tube in position with tip projecting over the lower right hemithorax. Moderate right pleural effusion again noted. No appreciable right pneumothorax confidently identified on today's examination. Irregular opacities throughout the right mid to lower lung may reflect areas of atelectasis and/or consolidation. A few scattered ill-defined reticulonodular opacities are noted throughout the left mid to lower lung, corresponding to scattered areas of nodularity seen on prior CT the abdomen and pelvis 01/07/2019. Left lung is otherwise clear. No left pleural effusion. No evidence of pulmonary edema. Heart size is normal. IMPRESSION: 1. Read chemo elation of right-sided pleural fluid. Moderate pleural effusion noted at this time. No residual pneumothorax confidently identified. 2. Patchy ill-defined opacities throughout the lungs bilaterally (right greater than left). Based on correlation with prior chest CT, findings are favored to reflect metastatic disease with potential lymphangitic spread of tumor in the right lung. Underlying infectious consolidation is not entirely excluded, but is not strongly favored. Electronically Signed   By: Vinnie Langton M.D.   On: 01/10/2019 09:41   Dg Chest Port 1 View  Result Date: 01/09/2019 CLINICAL DATA:  Lung cancer, post VATS EXAM: PORTABLE CHEST 1 VIEW COMPARISON:  Chest radiograph from one day prior. FINDINGS: Right internal jugular central venous catheter terminates at the cavoatrial junction. Stable cardiomediastinal silhouette with normal heart size. Right basilar chest tube in place. Stable small basilar right  pneumothorax. Falling loss in the right hemithorax is unchanged. Diffuse right pleural thickening is stable. Patchy right lung base consolidation is unchanged. Multiple small scattered vaguely nodular opacities in the left lung. IMPRESSION: 1. Small right basilar pneumothorax with right chest tube in place, stable. 2. Stable diffuse right pleural thickening with volume loss in the right hemithorax. Stable patchy right lung base consolidation. 3. Stable scattered small vaguely nodular opacities in the left lung, see 01/06/2019 chest CT report for details. Electronically Signed   By: Ilona Sorrel M.D.   On: 01/09/2019 09:54   Dg Chest Port 1 View  Result Date: 01/08/2019 CLINICAL DATA:  Post right VATS. EXAM: PORTABLE CHEST 1 VIEW COMPARISON:  CT 01/06/2019. FINDINGS: Right IJ line noted with tip over superior vena cava. Right chest tube noted with tip  over right lower chest. Right base pneumothorax noted. Right base atelectasis. Multiple pulmonary nodules are again noted. Heart size stable no acute bony abnormality. IMPRESSION: 1. Right chest tube noted with tip over right lower chest. right base pneumothorax noted on today's exam. 2.  Right IJ line noted with tip over superior vena cava. 3. Right base atelectasis. Multiple pulmonary nodules are again noted. Critical Value/emergent results were called by telephone at the time of interpretation on 01/08/2019 at 10:08 am to nurse Sharyn Lull, who verbally acknowledged these results. Electronically Signed   By: Marcello Moores  Register   On: 01/08/2019 10:11

## 2019-01-27 NOTE — Progress Notes (Signed)
START ON PATHWAY REGIMEN - Non-Small Cell Lung     A cycle is every 21 days:     Pembrolizumab      Pemetrexed      Carboplatin   **Always confirm dose/schedule in your pharmacy ordering system**  Patient Characteristics: Stage IV Metastatic, Nonsquamous, Initial Chemotherapy/Immunotherapy, PS = 0, 1, ALK Rearrangement Negative and EGFR Mutation Negative/Non-Sensitizing, PD-L1 Expression Positive 1-49% (TPS) / Negative / Not Tested / Awaiting Test Results and  Immunotherapy Candidate AJCC T Category: TX Current Disease Status: Distant Metastases AJCC N Category: N1 AJCC M Category: M1a AJCC 8 Stage Grouping: IVA Histology: Nonsquamous Cell ROS1 Rearrangement Status: Negative T790M Mutation Status: Not Applicable - EGFR Mutation Negative/Unknown Other Mutations/Biomarkers: No Other Actionable Mutations NTRK Gene Fusion Status: Negative PD-L1 Expression Status: PD-L1 Positive 1-49% (TPS) Chemotherapy/Immunotherapy LOT: Initial Chemotherapy/Immunotherapy Molecular Targeted Therapy: Not Appropriate ALK Rearrangement Status: Negative EGFR Mutation Status: Non-Sensitizing BRAF V600E Mutation Status: Negative ECOG Performance Status: 1 Immunotherapy Candidate Status: Candidate for Immunotherapy Intent of Therapy: Non-Curative / Palliative Intent, Discussed with Patient

## 2019-01-28 ENCOUNTER — Other Ambulatory Visit: Payer: Self-pay | Admitting: *Deleted

## 2019-01-28 ENCOUNTER — Other Ambulatory Visit: Payer: Self-pay | Admitting: Hematology

## 2019-01-28 ENCOUNTER — Telehealth: Payer: Self-pay | Admitting: *Deleted

## 2019-01-28 DIAGNOSIS — C3490 Malignant neoplasm of unspecified part of unspecified bronchus or lung: Secondary | ICD-10-CM

## 2019-01-28 DIAGNOSIS — J910 Malignant pleural effusion: Secondary | ICD-10-CM

## 2019-01-28 LAB — CANCER ANTIGEN 19-9: CA 19-9: 6 U/mL (ref 0–35)

## 2019-01-28 MED ORDER — MORPHINE SULFATE ER 15 MG PO TBCR: 15.0000 mg | EXTENDED_RELEASE_TABLET | Freq: Two times a day (BID) | ORAL | 0 refills | Status: AC

## 2019-01-28 NOTE — Telephone Encounter (Signed)
Received call from Merry Proud from the [facility] in [place]. Pt's MS Contin prescription cannot be fax'd with 'Hospice patient' on it. It needs to have the phrase " Authorization for emergency dispensing due to [place] 19' written on it for the pharmacy to be able to fill it via a fax'd script.  He also asked for a verbal order. This was provided and prscription fax'd per instructions.  Fax # [phone] Phone [phone], ext. [phone]

## 2019-01-29 ENCOUNTER — Other Ambulatory Visit: Payer: Self-pay | Admitting: *Deleted

## 2019-01-31 ENCOUNTER — Emergency Department (HOSPITAL_COMMUNITY): Payer: Self-pay

## 2019-01-31 ENCOUNTER — Encounter (HOSPITAL_COMMUNITY): Payer: Self-pay

## 2019-01-31 ENCOUNTER — Inpatient Hospital Stay (HOSPITAL_COMMUNITY)
Admission: EM | Admit: 2019-01-31 | Discharge: 2019-03-04 | DRG: 175 | Disposition: E | Payer: No Typology Code available for payment source | Attending: Internal Medicine | Admitting: Internal Medicine

## 2019-01-31 DIAGNOSIS — I82453 Acute embolism and thrombosis of peroneal vein, bilateral: Secondary | ICD-10-CM | POA: Diagnosis present

## 2019-01-31 DIAGNOSIS — J910 Malignant pleural effusion: Secondary | ICD-10-CM | POA: Diagnosis not present

## 2019-01-31 DIAGNOSIS — Z79899 Other long term (current) drug therapy: Secondary | ICD-10-CM

## 2019-01-31 DIAGNOSIS — K219 Gastro-esophageal reflux disease without esophagitis: Secondary | ICD-10-CM | POA: Diagnosis present

## 2019-01-31 DIAGNOSIS — E1122 Type 2 diabetes mellitus with diabetic chronic kidney disease: Secondary | ICD-10-CM | POA: Diagnosis present

## 2019-01-31 DIAGNOSIS — J948 Other specified pleural conditions: Secondary | ICD-10-CM | POA: Diagnosis present

## 2019-01-31 DIAGNOSIS — J9621 Acute and chronic respiratory failure with hypoxia: Secondary | ICD-10-CM | POA: Diagnosis present

## 2019-01-31 DIAGNOSIS — D75839 Thrombocytosis, unspecified: Secondary | ICD-10-CM | POA: Diagnosis present

## 2019-01-31 DIAGNOSIS — Z833 Family history of diabetes mellitus: Secondary | ICD-10-CM

## 2019-01-31 DIAGNOSIS — R0902 Hypoxemia: Secondary | ICD-10-CM

## 2019-01-31 DIAGNOSIS — I82432 Acute embolism and thrombosis of left popliteal vein: Secondary | ICD-10-CM | POA: Diagnosis present

## 2019-01-31 DIAGNOSIS — J9622 Acute and chronic respiratory failure with hypercapnia: Secondary | ICD-10-CM | POA: Diagnosis present

## 2019-01-31 DIAGNOSIS — J9601 Acute respiratory failure with hypoxia: Secondary | ICD-10-CM | POA: Diagnosis not present

## 2019-01-31 DIAGNOSIS — E871 Hypo-osmolality and hyponatremia: Secondary | ICD-10-CM | POA: Diagnosis present

## 2019-01-31 DIAGNOSIS — I2699 Other pulmonary embolism without acute cor pulmonale: Secondary | ICD-10-CM | POA: Diagnosis not present

## 2019-01-31 DIAGNOSIS — D72829 Elevated white blood cell count, unspecified: Secondary | ICD-10-CM | POA: Diagnosis present

## 2019-01-31 DIAGNOSIS — Z8249 Family history of ischemic heart disease and other diseases of the circulatory system: Secondary | ICD-10-CM

## 2019-01-31 DIAGNOSIS — E119 Type 2 diabetes mellitus without complications: Secondary | ICD-10-CM | POA: Diagnosis present

## 2019-01-31 DIAGNOSIS — I82461 Acute embolism and thrombosis of right calf muscular vein: Secondary | ICD-10-CM | POA: Diagnosis present

## 2019-01-31 DIAGNOSIS — D649 Anemia, unspecified: Secondary | ICD-10-CM | POA: Diagnosis present

## 2019-01-31 DIAGNOSIS — I82411 Acute embolism and thrombosis of right femoral vein: Secondary | ICD-10-CM | POA: Diagnosis present

## 2019-01-31 DIAGNOSIS — Z7984 Long term (current) use of oral hypoglycemic drugs: Secondary | ICD-10-CM

## 2019-01-31 DIAGNOSIS — E872 Acidosis: Secondary | ICD-10-CM | POA: Diagnosis present

## 2019-01-31 DIAGNOSIS — Z20828 Contact with and (suspected) exposure to other viral communicable diseases: Secondary | ICD-10-CM | POA: Diagnosis present

## 2019-01-31 DIAGNOSIS — C3490 Malignant neoplasm of unspecified part of unspecified bronchus or lung: Secondary | ICD-10-CM | POA: Diagnosis not present

## 2019-01-31 DIAGNOSIS — C3491 Malignant neoplasm of unspecified part of right bronchus or lung: Secondary | ICD-10-CM | POA: Diagnosis not present

## 2019-01-31 DIAGNOSIS — J9811 Atelectasis: Secondary | ICD-10-CM | POA: Diagnosis present

## 2019-01-31 DIAGNOSIS — I82443 Acute embolism and thrombosis of tibial vein, bilateral: Secondary | ICD-10-CM | POA: Diagnosis present

## 2019-01-31 DIAGNOSIS — R0602 Shortness of breath: Secondary | ICD-10-CM

## 2019-01-31 DIAGNOSIS — Z515 Encounter for palliative care: Secondary | ICD-10-CM | POA: Diagnosis not present

## 2019-01-31 DIAGNOSIS — E8770 Fluid overload, unspecified: Secondary | ICD-10-CM | POA: Diagnosis present

## 2019-01-31 DIAGNOSIS — D630 Anemia in neoplastic disease: Secondary | ICD-10-CM | POA: Diagnosis present

## 2019-01-31 DIAGNOSIS — G893 Neoplasm related pain (acute) (chronic): Secondary | ICD-10-CM | POA: Diagnosis present

## 2019-01-31 DIAGNOSIS — Z978 Presence of other specified devices: Secondary | ICD-10-CM

## 2019-01-31 DIAGNOSIS — D473 Essential (hemorrhagic) thrombocythemia: Secondary | ICD-10-CM | POA: Diagnosis present

## 2019-01-31 DIAGNOSIS — I2694 Multiple subsegmental pulmonary emboli without acute cor pulmonale: Principal | ICD-10-CM | POA: Diagnosis present

## 2019-01-31 DIAGNOSIS — Z66 Do not resuscitate: Secondary | ICD-10-CM | POA: Diagnosis not present

## 2019-01-31 DIAGNOSIS — F17210 Nicotine dependence, cigarettes, uncomplicated: Secondary | ICD-10-CM | POA: Diagnosis present

## 2019-01-31 HISTORY — DX: Type 2 diabetes mellitus without complications: E11.9

## 2019-01-31 LAB — CBC
HCT: 38.6 % — ABNORMAL LOW (ref 39.0–52.0)
Hemoglobin: 12.2 g/dL — ABNORMAL LOW (ref 13.0–17.0)
MCH: 27.4 pg (ref 26.0–34.0)
MCHC: 31.6 g/dL (ref 30.0–36.0)
MCV: 86.7 fL (ref 80.0–100.0)
Platelets: 444 10*3/uL — ABNORMAL HIGH (ref 150–400)
RBC: 4.45 MIL/uL (ref 4.22–5.81)
RDW: 15.1 % (ref 11.5–15.5)
WBC: 20.8 10*3/uL — ABNORMAL HIGH (ref 4.0–10.5)
nRBC: 0 % (ref 0.0–0.2)

## 2019-01-31 LAB — BLOOD GAS, ARTERIAL
Acid-base deficit: 0.8 mmol/L (ref 0.0–2.0)
Bicarbonate: 25.8 mmol/L (ref 20.0–28.0)
Drawn by: 270211
O2 Content: 6 L/min
O2 Saturation: 90.1 %
Patient temperature: 98.6
pCO2 arterial: 54.6 mmHg — ABNORMAL HIGH (ref 32.0–48.0)
pH, Arterial: 7.296 — ABNORMAL LOW (ref 7.350–7.450)
pO2, Arterial: 71.5 mmHg — ABNORMAL LOW (ref 83.0–108.0)

## 2019-01-31 LAB — COMPREHENSIVE METABOLIC PANEL
ALT: 22 U/L (ref 0–44)
AST: 19 U/L (ref 15–41)
Albumin: 3.3 g/dL — ABNORMAL LOW (ref 3.5–5.0)
Alkaline Phosphatase: 101 U/L (ref 38–126)
Anion gap: 17 — ABNORMAL HIGH (ref 5–15)
BUN: 10 mg/dL (ref 6–20)
CO2: 25 mmol/L (ref 22–32)
Calcium: 9.2 mg/dL (ref 8.9–10.3)
Chloride: 89 mmol/L — ABNORMAL LOW (ref 98–111)
Creatinine, Ser: 0.78 mg/dL (ref 0.61–1.24)
GFR calc Af Amer: >60 mL/min (ref >60)
GFR calc non Af Amer: >60 mL/min (ref >60)
Glucose, Bld: 143 mg/dL — ABNORMAL HIGH (ref 70–99)
Potassium: 4.4 mmol/L (ref 3.5–5.1)
Sodium: 131 mmol/L — ABNORMAL LOW (ref 135–145)
Total Bilirubin: 0.8 mg/dL (ref 0.3–1.2)
Total Protein: 7.5 g/dL (ref 6.5–8.1)

## 2019-01-31 LAB — PROTIME-INR
INR: 1.3 — ABNORMAL HIGH (ref 0.8–1.2)
Prothrombin Time: 16.5 seconds — ABNORMAL HIGH (ref 11.4–15.2)

## 2019-01-31 LAB — SARS CORONAVIRUS 2 BY RT PCR (HOSPITAL ORDER, PERFORMED IN ~~LOC~~ HOSPITAL LAB): SARS Coronavirus 2: NEGATIVE

## 2019-01-31 LAB — HEPARIN LEVEL (UNFRACTIONATED): Heparin Unfractionated: <0.1 IU/mL — ABNORMAL LOW (ref 0.30–0.70)

## 2019-01-31 LAB — APTT: aPTT: 32 seconds (ref 24–36)

## 2019-01-31 LAB — LACTIC ACID, PLASMA: Lactic Acid, Venous: 2.6 mmol/L (ref 0.5–1.9)

## 2019-01-31 MED ORDER — SODIUM CHLORIDE (PF) 0.9 % IJ SOLN
INTRAMUSCULAR | Status: AC
  Filled 2019-01-31: qty 50

## 2019-01-31 MED ORDER — PANTOPRAZOLE SODIUM 40 MG PO TBEC: 40.0000 mg | DELAYED_RELEASE_TABLET | Freq: Every day | ORAL | Status: DC

## 2019-01-31 MED ORDER — MORPHINE SULFATE ER 15 MG PO TBCR: 15.0000 mg | EXTENDED_RELEASE_TABLET | Freq: Two times a day (BID) | ORAL | Status: DC

## 2019-01-31 MED ORDER — MORPHINE SULFATE 15 MG PO TABS
15.0000 mg | ORAL_TABLET | ORAL | Status: DC | PRN
  Administered 2019-02-01 – 2019-02-04 (×7): 15 mg via ORAL
  Filled 2019-01-31 (×7): qty 1

## 2019-01-31 MED ORDER — MORPHINE SULFATE 15 MG PO TABS: 15.0000 mg | ORAL_TABLET | Freq: Four times a day (QID) | ORAL | Status: DC | PRN

## 2019-01-31 MED ORDER — IPRATROPIUM-ALBUTEROL 0.5-2.5 (3) MG/3ML IN SOLN
3.0000 mL | Freq: Four times a day (QID) | RESPIRATORY_TRACT | Status: DC
  Administered 2019-01-31 – 2019-02-04 (×15): 3 mL via RESPIRATORY_TRACT
  Filled 2019-01-31 (×16): qty 3

## 2019-01-31 MED ORDER — CHLORHEXIDINE GLUCONATE CLOTH 2 % EX PADS
6.0000 | MEDICATED_PAD | Freq: Every day | CUTANEOUS | Status: DC
  Administered 2019-01-31: 17:00:00 6 via TOPICAL

## 2019-01-31 MED ORDER — MORPHINE SULFATE (PF) 4 MG/ML IV SOLN
5.0000 mg | INTRAVENOUS | Status: DC | PRN
  Administered 2019-01-31: 17:00:00 5 mg via INTRAVENOUS
  Administered 2019-02-01 – 2019-02-04 (×2): 10 mg via INTRAVENOUS
  Filled 2019-01-31: qty 2
  Filled 2019-01-31 (×2): qty 3

## 2019-01-31 MED ORDER — TEMAZEPAM 15 MG PO CAPS
15.0000 mg | ORAL_CAPSULE | Freq: Every day | ORAL | Status: DC
  Administered 2019-01-31 – 2019-02-03 (×4): 15 mg via ORAL
  Filled 2019-01-31 (×4): qty 1

## 2019-01-31 MED ORDER — LORAZEPAM 2 MG/ML IJ SOLN
0.5000 mg | Freq: Once | INTRAMUSCULAR | Status: AC
  Administered 2019-01-31: 12:00:00 0.5 mg via INTRAVENOUS
  Filled 2019-01-31: qty 1

## 2019-01-31 MED ORDER — PANTOPRAZOLE SODIUM 40 MG PO TBEC
40.0000 mg | DELAYED_RELEASE_TABLET | Freq: Two times a day (BID) | ORAL | Status: DC
  Administered 2019-02-01 – 2019-02-04 (×5): 40 mg via ORAL
  Filled 2019-01-31 (×6): qty 1

## 2019-01-31 MED ORDER — HEPARIN (PORCINE) 25000 UT/250ML-% IV SOLN
2150.0000 [IU]/h | INTRAVENOUS | Status: DC
  Administered 2019-01-31: 1700 [IU]/h via INTRAVENOUS
  Administered 2019-02-01: 2150 [IU]/h via INTRAVENOUS
  Administered 2019-02-01: 1900 [IU]/h via INTRAVENOUS
  Administered 2019-02-02 (×2): 2150 [IU]/h via INTRAVENOUS
  Filled 2019-01-31 (×6): qty 250

## 2019-01-31 MED ORDER — MORPHINE SULFATE ER 15 MG PO TBCR
15.0000 mg | EXTENDED_RELEASE_TABLET | Freq: Two times a day (BID) | ORAL | Status: DC
  Administered 2019-01-31 – 2019-02-04 (×8): 15 mg via ORAL
  Filled 2019-01-31 (×9): qty 1

## 2019-01-31 MED ORDER — MORPHINE SULFATE (PF) 2 MG/ML IV SOLN
1.0000 mg | INTRAVENOUS | Status: DC | PRN
  Administered 2019-01-31: 2 mg via INTRAVENOUS
  Filled 2019-01-31: qty 1

## 2019-01-31 MED ORDER — HEPARIN BOLUS VIA INFUSION
4000.0000 [IU] | Freq: Once | INTRAVENOUS | Status: AC
  Administered 2019-01-31: 15:00:00 4000 [IU] via INTRAVENOUS
  Filled 2019-01-31: qty 4000

## 2019-01-31 MED ORDER — IOHEXOL 350 MG/ML SOLN
100.0000 mL | Freq: Once | INTRAVENOUS | Status: AC | PRN
  Administered 2019-01-31: 13:00:00 100 mL via INTRAVENOUS

## 2019-01-31 MED ORDER — ORAL CARE MOUTH RINSE
15.0000 mL | Freq: Two times a day (BID) | OROMUCOSAL | Status: DC
  Administered 2019-01-31 – 2019-02-04 (×7): 15 mL via OROMUCOSAL

## 2019-01-31 NOTE — ED Notes (Addendum)
Spoke to patient sister. Sister does not want any scans done. Sister states that the tubing was kinked on home oxygen and is the reason why patient was shob..    Stage 4 lung cancer.   Patient is in hospice.   [phone].

## 2019-01-31 NOTE — ED Triage Notes (Addendum)
Pt arrived from home via [facility] c/o SOB started 0200 this morning. Pt Hx of stage 4 lung cancer and hospice care. Pt reports 1 fall due to dizziness but no r/t pain  at this time. Pt usuallay on 2L at home increased to 4 with no relief. Transported with EMS on 15: NRB with 98% sat. 20 G left AC. Pt last took scheduled at home morphine at 0600.

## 2019-01-31 NOTE — ED Notes (Signed)
Karleen Hampshire, MD at bedside and aware patient requesting pain medication.   Patent urinated in bed. Patient cleaned and new linens placed.

## 2019-01-31 NOTE — ED Notes (Signed)
Gave report to floor nurse. Was informed pt cant be transported at this time due to floor isolation room not prepared to receive. Will follow up.

## 2019-01-31 NOTE — Consult Note (Signed)
PULMONARY / CRITICAL CARE MEDICINE   NAME:  Andrew Davenport, MRN:  [id number], DOB:  04-23-1956, LOS: 0 ADMISSION DATE:  01/18/2019, CONSULTATION DATE:  01/29/2019 REFERRING MD:  [place]  CHIEF COMPLAINT:  SOB   BRIEF HISTORY:    63 yo male Veteran/ quit smoking July 2020  with Stage IV lung ca/ malignant R effusion  under hospice care with pleurex drain to R chest sp vat pleurodesis 01/08/19  acutely worse sob am   8/30 to ER where had Segmental PE on L so started on heparin and transerred to ICU and PCCM asked to see him on arrival.    HISTORY OF PRESENT ILLNESS    HEME/ONC OVERVIEW: 1. 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 Malignant right pleural effusion s/p PleurX at [facility] in 12/2018   Oncology rec 01/27/19: molecular studies showed PD-L1 of 10% but no other targetable mutation -Patient just started home hospice earlier this week (unclear who had ordered hospice service). rec  cannot be on hospice and receive palliative treatment at the same time. -Dr Maylon Peppers reviewed NCCN guidelines detail and some of the treatment options if he decides to come off hospice. -Based on the PD-L1 of 10% rec for  regimen of carboplatin, pemetrexed and pembrolizumab x 4 cycles, followed by maintenance pembrolizumab +/-pemetrexed until disease progression or unacceptable toxicities.  Acutely worse sob 0200  8/30 s cp, cough, n or V > to ER 10:40 am  Had been on 2lpm 02 at home increased to 4lpm and continue scheduled MS prior to coming to ER where documented new L sided segmental PE   Since his  ov with oncology pt has decided to forego chemo but wants reversible problems addressed and comfort measures continued/ agrees NCB in event of arrent/ DNI/ no advanced life support  Last drained 330 cc 36 h prior to admit "then chest started pulling or R so stopped drainage then acute pleuritic cp on L day of admit with sob. Some mucoid sputum.   No obvious day to day or daytime variability or assoc excess/ purulent sputum or mucus plugs or hemoptysis or  subjective wheeze or overt sinus or hb symptoms.    Also denies any obvious fluctuation of symptoms with weather or environmental changes or other aggravating or alleviating factors except as outlined above   No unusual exposure hx or h/o childhood pna/ asthma or knowledge of premature birth.  Current Allergies, Complete Past Medical History, Past Surgical History, Family History, and Social History were reviewed in [employer] record.  ROS  The following are not active complaints unless bolded Hoarseness, sore throat, dysphagia, dental problems, itching, sneezing,  nasal congestion or discharge of excess mucus or purulent secretions, ear ache,   fever, chills, sweats, unintended wt loss or wt gain, classically  exertional cp,  orthopnea pnd or arm/hand swelling  or leg swelling x 4 years prev neg venous dopplers, presyncope, palpitations, abdominal pain, anorexia, nausea, vomiting, diarrhea  or change in bowel habits or change in bladder habits, change in stools or change in urine, dysuria, hematuria,  rash, arthralgias, visual complaints, headache, numbness, weakness or ataxia or problems with walking or coordination,  change in mood or  memory.              SIGNIFICANT PAST MEDICAL HISTORY   [place] IV lung ca dx 01/2019 to R pleura   SIGNIFICANT EVENTS:  Heparin IV initiated 8/30 3pm  Drainage applied to R pleurex 8/30   STUDIES:   CTa 8/30 with large R Hydropneumo with mild vol loss on R and multiplle segmental  PE on L   CULTURES:  Covid 19 PCR 8/30 : neg   ANTIBIOTICS:     LINES/TUBES:    CONSULTANTS:  PCCM 8/30 pm   SUBJECTIVE:  Sob at rest, pain with deep breath L lateral  CONSTITUTIONAL: BP 120/64   Pulse (!) 130   Temp 97.6 F (36.4 C) (Oral)   Resp (!) 39   SpO2 98%  5lpm   No intake/output data recorded.        PHYSICAL EXAM: General:  Uncomfortable moderately anxious bm able to talk in full phrases on NP Neuro:  Alert/ approp/ nl sensorium/ quite sharp on details of care HEENT:  orophx clear, no nodes Cardiovascular:  RRR slt increase P2 no m Lungs:  decreased on R with dullness/ clear on L/ pleurex drain onR   Abdomen:  Soft/ benign Musculoskeletal:  No deformities  1+ pitting R > L s calf tenderness Skin:  No breakdown       ASSESSMENT AND PLAN    1) Acute multiple PE on L in pt with very poor R lung function, so this is equivalent to bilateral PE> acute on chronic  resp failure with hypoxemia and hypercarbia  - main issue is resp distress, not shock or hypoxemia or cp in pt on maint MS so should continue  Opioids and adjust FIO2 as needed   -  DNI status/ NCB   2) Malignant R pl effusion - s/p vats/ pleurodesis Dr Roxan Hockey 01/08/19 s fob at this point - min volume loss on R s  main issues are effusion > ptx > consolidation/atx (not mass effect though so that may be why he could only drain 330 cc last attempt)  but should respond some in terms of comfort with sob to drainage of effusion  - prognosis remains very guarded given above combination but firmly expresses desire at no escalation of care beyond present level:  Hep/ 02/analgesics   SUMMARY OF TODAY'S PLAN:  Admit to step down/ continue IV hep/ drain R effusion as much as he tolerates   Best Practice / Goals of Care / Disposition.   DVT PROPHYLAXIS: full hep SUP:ppi NUTRITION:npo x meds/ advance as tol  MOBILITY:[facility] for now [place] with hospice if survives this admit FAMILY  DISCUSSIONS:  None at bedsie  DISPOSITION stepdown  Status so we can fulfill wishes for acute palliation of symptoms  LABS  Glucose No results for input(s): GLUCAP in the last 168 hours.  BMET Recent Labs  Lab 01/27/19 1321 01/30/2019 1032  NA 134* 131*  K 4.1 4.4  CL 95* 89*  CO2 26 25  BUN 10 10  CREATININE 0.86 0.78  GLUCOSE 116* 143*    Liver Enzymes Recent Labs  Lab 01/27/19 1321 01/12/2019 1032  AST 18 19  ALT 21 22  ALKPHOS 109 101  BILITOT 0.4 0.8  ALBUMIN 3.3* 3.3*    Electrolytes Recent Labs  Lab 01/27/19 1321 01/07/2019 1032  CALCIUM 9.4 9.2    CBC Recent Labs  Lab 01/27/19 1321 01/27/2019 1032  WBC 11.0* 20.8*  HGB 11.0* 12.2*  HCT 34.7* 38.6*  PLT 419* 444*    ABG Recent Labs  Lab 01/25/2019 1527  PHART 7.296*  PCO2ART 54.6*  PO2ART 71.5*    Coag's Recent Labs  Lab 01/29/2019 1459  APTT 32  INR 1.3*    Sepsis Markers Recent Labs  Lab 01/22/2019 1525  LATICACIDVEN 2.6*    Cardiac Enzymes No results for input(s): TROPONINI, PROBNP in the last 168 hours.  PAST MEDICAL HISTORY :   He  has a past medical history of Diabetes mellitus without complication ([place]) and GERD (gastroesophageal reflux disease).  PAST SURGICAL HISTORY:  He  has a past surgical history that includes Video assisted thoracoscopy (Right, 01/08/2019) and Pleural biopsy (Right, 01/08/2019).  No Known Allergies  No current facility-administered medications on file prior to encounter.    Current Outpatient Medications on File Prior to Encounter  Medication Sig  . metFORMIN (GLUCOPHAGE-XR) 750 MG 24 hr tablet Take 750 mg by mouth daily with breakfast.  . morphine (MSIR) 15 MG tablet Take 1 tablet (15 mg total) by mouth every 6 (six) hours as needed for severe pain.  . Multiple Vitamin (MULTIVITAMIN WITH MINERALS) TABS tablet Take 1 tablet by mouth daily.  . pantoprazole (PROTONIX) 40 MG tablet Take 40 mg by mouth daily.  . prochlorperazine (COMPAZINE) 10 MG tablet  Take 1 tablet (10 mg total) by mouth every 6 (six) hours as needed for nausea or vomiting.  [name] morphine (MS CONTIN) 15 MG 12 hr tablet Take 1 tablet (15 mg total) by mouth every 12 (twelve) hours.  . polyethylene glycol (MIRALAX / GLYCOLAX) 17 g packet Take 17 g by mouth daily. (Patient not taking: Reported on 01/04/2019)  . senna-docusate (SENOKOT-S) 8.6-50 MG tablet Take 1 tablet by mouth at bedtime. (Patient not taking: Reported on 01/11/2019)  . temazepam (RESTORIL) 15 MG capsule Take 15 mg by mouth at bedtime.    FAMILY HISTORY:   His family history is not on file.  SOCIAL HISTORY:  He  reports that he has been smoking. He has a 10.75 pack-year smoking history. He has never used smokeless tobacco. He reports that he does not drink alcohol or use drugs.     The patient is critically ill with multiple organ systems failure and requires high complexity decision making for assessment and support, frequent evaluation and titration of therapies, application of advanced monitoring technologies and extensive interpretation of multiple databases. Critical Care Time devoted to patient care services described in this note is 60 minutes.

## 2019-01-31 NOTE — Progress Notes (Signed)
bipap left on standby not placed on pt per dr wert.

## 2019-01-31 NOTE — ED Provider Notes (Signed)
[place] DEPT Provider Note   CSN: [id number] Arrival date & time: 01/19/2019  1007     History   Chief Complaint Chief Complaint  Patient presents with  . Shortness of Breath    HPI Andrew Davenport is a 63 y.o. male.     HPI Patient presents to the emergency department with increasing shortness of breath.  The patient states this started earlier this morning.  The patient states that he has lung cancer and his been placed in hospice care.  The patient states he has not had much longer to live.  He states that he is not had any pain but the increased work of breathing is what brought him to the hospital.  The patient denies chest pain headache,blurred vision, neck pain, fever, cough, weakness, numbness, dizziness, anorexia, edema, abdominal pain, nausea, vomiting, diarrhea, rash, back pain, dysuria, hematemesis, bloody stool, near syncope, or syncope. Past Medical History:  Diagnosis Date  . Diabetes mellitus without complication ([facility])   . GERD (gastroesophageal reflux disease)     Patient Active Problem List   Diagnosis Date Noted  . Acute respiratory failure with hypoxia ([facility]) 01/02/2019  . Leukocytosis 01/27/2019  . Normocytic anemia 01/27/2019  . Thrombocytosis ([facility]) 01/27/2019  . Adenocarcinoma of lung ([facility])   . Hyponatremia   . Type 2 diabetes mellitus with chronic kidney disease, without long-term current use of insulin ([facility])   . Palliative care encounter   . Hypoxemia   . Non-small cell lung cancer ([name])   . Goals of care, counseling/discussion   . Malignant pleural effusion 01/06/2019    Past Surgical History:  Procedure Laterality Date  . PLEURAL BIOPSY Right 01/08/2019   Procedure: PLEURAL BIOPSY;  Surgeon: [name], MD;  Location: [facility];  Service: Thoracic;  Laterality: Right;  [name] VIDEO ASSISTED THORACOSCOPY Right 01/08/2019   Procedure: VIDEO ASSISTED THORACOSCOPY;  Surgeon: [name], MD;  Location: [facility]  OR;  Service: Thoracic;  Laterality: Right;        Home Medications    Prior to Admission medications   Medication Sig Start Date End Date Taking? Authorizing Provider  metFORMIN (GLUCOPHAGE-XR) 750 MG 24 hr tablet Take 750 mg by mouth daily with breakfast.   Yes [provider]  morphine (MSIR) 15 MG tablet Take 1 tablet (15 mg total) by mouth every 6 (six) hours as needed for severe pain. 01/19/19  Yes Tish Men, MD  Multiple Vitamin (MULTIVITAMIN WITH MINERALS) TABS tablet Take 1 tablet by mouth daily. 01/12/19  Yes Enid Derry, [place], DO  pantoprazole (PROTONIX) 40 MG tablet Take 40 mg by mouth daily.   Yes [provider]  prochlorperazine (COMPAZINE) 10 MG tablet Take 1 tablet (10 mg total) by mouth every 6 (six) hours as needed for nausea or vomiting. 01/19/19  Yes Tish Men, MD  morphine (MS CONTIN) 15 MG 12 hr tablet Take 1 tablet (15 mg total) by mouth every 12 (twelve) hours. 01/28/19 02/27/19  Tish Men, MD  polyethylene glycol (MIRALAX / GLYCOLAX) 17 g packet Take 17 g by mouth daily. Patient not taking: Reported on 01/17/2019 01/12/19   Shirley, [place], DO  senna-docusate (SENOKOT-S) 8.6-50 MG tablet Take 1 tablet by mouth at bedtime. Patient not taking: Reported on 01/20/2019 01/12/19   Shirley, [place], DO  temazepam (RESTORIL) 15 MG capsule Take 15 mg by mouth at bedtime. 01/23/19   [provider]    Family History History reviewed. No pertinent family history.  Social History  Social History   Tobacco Use  . Smoking status: Current Every Day Smoker    Packs/day: 0.25    Years: 43.00    Pack years: 10.75  . Smokeless tobacco: Never Used  Substance Use Topics  . Alcohol use: No    Alcohol/week: 0.0 standard drinks  . Drug use: No     Allergies   Patient has no known allergies.   Review of Systems Review of Systems All other systems negative except as documented in the HPI. All pertinent positives and negatives as reviewed in the HPI.   Physical Exam Updated Vital Signs BP (!) 145/100   Pulse (!) 135   Temp 97.6 F (36.4 C) (Oral)   Resp (!) 43   SpO2 96%   Physical Exam Vitals signs and nursing note reviewed.  Constitutional:      General: He is not in acute distress.    Appearance: He is well-developed.  HENT:     Head: Normocephalic and atraumatic.  Eyes:     Pupils: Pupils are equal, round, and reactive to light.  Neck:     Musculoskeletal: Normal range of motion and neck supple.  Cardiovascular:     Rate and Rhythm: Normal rate and regular rhythm.     Heart sounds: Normal heart sounds. No murmur. No friction rub. No gallop.   Pulmonary:     Effort: Pulmonary effort is normal. No respiratory distress.     Breath sounds: Examination of the right-upper field reveals decreased breath sounds. Examination of the right-middle field reveals decreased breath sounds. Examination of the right-lower field reveals decreased breath sounds. Decreased breath sounds and rhonchi present. No wheezing.  Abdominal:     General: Bowel sounds are normal. There is no distension.     Palpations: Abdomen is soft.     Tenderness: There is no abdominal tenderness.  Skin:    General: Skin is warm and dry.     Capillary Refill: Capillary refill takes less than 2 seconds.     Findings: No erythema or rash.  Neurological:     Mental Status: He is alert and oriented to person, place, and time.     Motor: No abnormal muscle tone.     Coordination: Coordination normal.  Psychiatric:        Behavior: Behavior normal.      ED Treatments / Results  Labs (all labs ordered are listed, but only abnormal results are displayed) Labs Reviewed  COMPREHENSIVE METABOLIC PANEL - Abnormal; Notable for the following components:      Result Value   Sodium 131 (*)    Chloride 89 (*)    Glucose, Bld 143 (*)    Albumin 3.3 (*)    Anion gap 17 (*)    All other components within normal limits  CBC - Abnormal; Notable for the following  components:   WBC 20.8 (*)    Hemoglobin 12.2 (*)    HCT 38.6 (*)    Platelets 444 (*)    All other components within normal limits  SARS CORONAVIRUS 2 (HOSPITAL ORDER, [place] [facility])  PROTIME-INR  APTT  HEPARIN LEVEL (UNFRACTIONATED)    EKG None  Radiology Dg Chest 2 View  Result Date: 02/01/2019 CLINICAL DATA:  Stage IV lung cancer, on hospice. Fall. Worsening oxygen demands. EXAM: CHEST - 2 VIEW COMPARISON:  01/10/2019.  CT of 01/06/2019. FINDINGS: Motion degraded AP radiograph. The lateral view is of better quality. Patient rotated minimally right. Midline trachea. Normal  heart size. Atherosclerosis in the transverse aorta. Tiny left pleural effusion, new. No pneumothorax. Worsened right-sided aeration, with near complete whiteout. Right-sided chest tube in place. Although there is no well-defined pleural air on the frontal view, fluid level on the lateral suggests hydropneumothorax. Worsened left-sided aeration, with interstitial thickening and areas of probable atelectasis and pulmonary nodularity. IMPRESSION: Motion degraded frontal radiograph. Significantly worsened right-sided aeration, likely due to pleural fluid and airspace disease. Given the appearance on the lateral view, suspect an underlying right-sided hydropneumothorax, with chest tube in place. Worsened left-sided aeration, likely due to atelectasis and underlying pulmonary nodules. Aortic Atherosclerosis (ICD10-I70.0). Electronically Signed   By: Abigail Miyamoto M.D.   On: 01/18/2019 11:08   Ct Angio Chest Pe W/cm &/or Wo Cm  Result Date: 01/24/2019 CLINICAL DATA:  63 year old male with shortness of breath. History of non-small cell lung cancer and malignant pleural effusion. Patient with RIGHT pleural catheter. EXAM: CT ANGIOGRAPHY CHEST WITH CONTRAST TECHNIQUE: Multidetector CT imaging of the chest was performed using the standard protocol during bolus administration of intravenous contrast.  Multiplanar CT image reconstructions and MIPs were obtained to evaluate the vascular anatomy. CONTRAST:  12mL OMNIPAQUE IOHEXOL 350 MG/ML SOLN COMPARISON:  01/06/2019 CT FINDINGS: Cardiovascular: This is a technically satisfactory study. Segmental pulmonary emboli are identified within the LEFT UPPER lobe, lingula and LEFT LOWER lobe. Mild thoracic aortic atherosclerotic calcifications noted without thoracic aneurysm. A trace pericardial effusion is present. Mediastinum/Nodes: No definite enlarged lymph nodes. No mediastinal mass. Lungs/Pleura: A RIGHT pleural catheter is present with an increasing large RIGHT pleural effusion with small to moderate amount of anterior RIGHT pleural air. Near complete compressive atelectasis of the RIGHT lung is now noted. RIGHT pleural enhancement is again noted. A small - moderate LEFT pleural effusion is now noted. Multiple nodular opacities throughout the visualized LEFT lung are again identified and stable to slightly enlarged. There is no evidence of LEFT pneumothorax. Upper Abdomen: No acute abnormality. Musculoskeletal: Scattered sclerotic lesions within scattered ribs and thoracic spine again noted, some which have increased in size. A 1 cm sclerotic lesion within the T8 vertebral body previously measured 0.5 cm. Review of the MIP images confirms the above findings. IMPRESSION: 1. Multiple LEFT segmental pulmonary emboli. New small to moderate LEFT pleural effusion. 2. Increasing large RIGHT hydropneumothorax (much larger fluid component) with RIGHT pleural catheter in place. Near complete compressive atelectasis of the RIGHT lung now noted. RIGHT pleural enhancement again noted compatible with metastatic disease. 3. Stable to slightly enlarging diffuse nodules throughout the LEFT lung and enlarging sclerotic bony lesions, compatible with increasing metastatic disease. Critical Value/emergent results were called by telephone at the time of interpretation on 01/27/2019 at  1:44 pm to [facility], [facility] , who verbally acknowledged these results. Electronically Signed   By: Margarette Canada M.D.   On: 01/25/2019 13:48    Procedures Procedures (including critical care time)  Medications Ordered in ED Medications  sodium chloride (PF) 0.9 % injection (has no administration in time range)  heparin bolus via infusion 4,000 Units (4,000 Units Intravenous Bolus from Bag 01/14/2019 1502)    Followed by  heparin ADULT infusion 100 units/mL (25000 units/281mL sodium chloride 0.45%) (1,700 Units/hr Intravenous New Bag/Given 01/25/2019 1502)  iohexol (OMNIPAQUE) 350 MG/ML injection 100 mL (100 mLs Intravenous Contrast Given 01/15/2019 1245)  LORazepam (ATIVAN) injection 0.5 mg (0.5 mg Intravenous Given 01/18/2019 1217)     Initial Impression / Assessment and Plan / ED Course  I have reviewed the triage vital  signs and the nursing notes.  Pertinent labs & imaging results that were available during my care of the patient were reviewed by me and considered in my medical decision making (see chart for details).        I spoke with Dr. Karleen Hampshire from the [facility] hospitalist service.  She will admit the patient for further evaluation of his newfound PEs.  Patient was started on heparin.  Have advised the patient of the plan and all questions were answered.  Patient is stable otherwise here in the emergency department.  He is on 5-1/2 L of oxygen.  CRITICAL CARE Performed by: Resa Miner Torianne Laflam Total critical care time: 35 minutes Critical care time was exclusive of separately billable procedures and treating other patients. Critical care was necessary to treat or prevent imminent or life-threatening deterioration. Critical care was time spent personally by me on the following activities: development of treatment plan with patient and/or surrogate as well as nursing, discussions with consultants, evaluation of patient's response to treatment, examination of patient, obtaining history from  patient or surrogate, ordering and performing treatments and interventions, ordering and review of laboratory studies, ordering and review of radiographic studies, pulse oximetry and re-evaluation of patient's condition.   Final Clinical Impressions(s) / ED Diagnoses   Final diagnoses:  None    ED Discharge Orders    None       Dalia Heading, PA-C 01/09/2019 1507    Lacretia Leigh, MD 02/01/19 1452

## 2019-01-31 NOTE — ED Notes (Signed)
Isolated room is now ready on [address].

## 2019-01-31 NOTE — Progress Notes (Signed)
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    He remains a full code.  His sister activated EMS for his increased SOB.    [facility] will continue to follow should he be admitted.     Venia Carbon RN, BSN, [facility] Liaison (in [place]) [phone]

## 2019-01-31 NOTE — ED Notes (Signed)
Gerald Stabs, PA at bedside and made aware of patient VS.

## 2019-01-31 NOTE — ED Notes (Addendum)
ED TO INPATIENT HANDOFF REPORT  Name/Age/Gender Andrew Davenport 63 y.o. male  Code Status Code Status History    Date Active Date Inactive Code Status Order ID Comments User Context   01/10/2019 1501 01/13/2019 1605 DNR [id number]  Fuller Canada, PA-C Inpatient   01/06/2019 1711 01/10/2019 1501 Full Code [id number]  Shirley, [place], DO ED   Advance Care Planning Activity    Questions for Most Recent Historical Code Status (Order [id number])    Question Answer Comment   In the event of cardiac or respiratory ARREST Do not call a "code blue"    In the event of cardiac or respiratory ARREST Do not perform Intubation, CPR, defibrillation or ACLS    In the event of cardiac or respiratory ARREST Use medication by any route, position, wound care, and other measures to relive pain and suffering. May use oxygen, suction and manual treatment of airway obstruction as needed for comfort.         Advance Directive Documentation     Most Recent Value  Type of Advance Directive  Living will, Healthcare Power of Attorney, Out of facility DNR (pink MOST or yellow form)  Pre-existing out of facility DNR order (yellow form or pink MOST form)  -  "MOST" Form in Place?  -      Home/SNF/Other Home  Chief Complaint shortness of breath  Level of Care/Admitting Diagnosis ED Disposition    ED Disposition Condition [place]: [place] [[id number]]  Level of Care: Stepdown [14]  Admit to SDU based on following criteria: Respiratory Distress:  Frequent assessment and/or intervention to maintain adequate ventilation/respiration, pulmonary toilet, and respiratory treatment.  Covid Evaluation: Asymptomatic Screening Protocol (No Symptoms)  Diagnosis: Acute respiratory failure with hypoxia [facility]) [[id number]]  Admitting Physician: Hosie Poisson [[id number]]  Attending Physician: Hosie Poisson [[id number]]  Estimated length of stay: past midnight tomorrow  Certification:: I certify  this patient will need inpatient services for at least 2 midnights  PT Class (Do Not Modify): Inpatient [101]  PT Acc Code (Do Not Modify): Private [1]       Medical History Past Medical History:  Diagnosis Date  . Diabetes mellitus without complication ([place])   . GERD (gastroesophageal reflux disease)     Allergies No Known Allergies  IV Location/Drains/Wounds Patient Lines/Drains/Airways Status   Active Line/Drains/Airways    Name:   Placement date:   Placement time:   Site:   Days:   Peripheral IV 01/09/2019 Left Antecubital   01/25/2019    1021    Antecubital   less than 1   Peripheral IV 01/03/2019 Right Forearm   01/17/2019    1456    Forearm   less than 1   Chest Tube Pleural   -    -    Pleural      Incision (Closed) 01/08/19 Chest Right   01/08/19    0920     23          Labs/Imaging Results for orders placed or performed during the hospital encounter of 01/25/2019 (from the past 48 hour(s))  Comprehensive metabolic panel     Status: Abnormal   Collection Time: 01/03/2019 10:32 AM  Result Value Ref Range   Sodium 131 (L) 135 - 145 mmol/L   Potassium 4.4 3.5 - 5.1 mmol/L   Chloride 89 (L) 98 - 111 mmol/L   CO2 25 22 - 32 mmol/L   Glucose, Bld 143 (H) 70 - 99  mg/dL   BUN 10 6 - 20 mg/dL   Creatinine, Ser 0.78 0.61 - 1.24 mg/dL   Calcium 9.2 8.9 - 10.3 mg/dL   Total Protein 7.5 6.5 - 8.1 g/dL   Albumin 3.3 (L) 3.5 - 5.0 g/dL   AST 19 15 - 41 U/L   ALT 22 0 - 44 U/L   Alkaline Phosphatase 101 38 - 126 U/L   Total Bilirubin 0.8 0.3 - 1.2 mg/dL   GFR calc non Af Amer >60 >60 mL/min   GFR calc Af Amer >60 >60 mL/min   Anion gap 17 (H) 5 - 15    Comment: Performed at [facility], [place] [address], [place], [place] [address]  CBC     Status: Abnormal   Collection Time: 01/22/2019 10:32 AM  Result Value Ref Range   WBC 20.8 (H) 4.0 - 10.5 K/uL   RBC 4.45 4.22 - 5.81 MIL/uL   Hemoglobin 12.2 (L) 13.0 - 17.0 g/dL   HCT 38.6 (L) 39.0 - 52.0 %   MCV 86.7 80.0  - 100.0 fL   MCH 27.4 26.0 - 34.0 pg   MCHC 31.6 30.0 - 36.0 g/dL   RDW 15.1 11.5 - 15.5 %   Platelets 444 (H) 150 - 400 K/uL   nRBC 0.0 0.0 - 0.2 %    Comment: Performed at [facility], [place] [address] [place], [place] [address]   Dg Chest 2 View  Result Date: 01/11/2019 CLINICAL DATA:  Stage IV lung cancer, on hospice. Fall. Worsening oxygen demands. EXAM: CHEST - 2 VIEW COMPARISON:  01/10/2019.  CT of 01/06/2019. FINDINGS: Motion degraded AP radiograph. The lateral view is of better quality. Patient rotated minimally right. Midline trachea. Normal heart size. Atherosclerosis in the transverse aorta. Tiny left pleural effusion, new. No pneumothorax. Worsened right-sided aeration, with near complete whiteout. Right-sided chest tube in place. Although there is no well-defined pleural air on the frontal view, fluid level on the lateral suggests hydropneumothorax. Worsened left-sided aeration, with interstitial thickening and areas of probable atelectasis and pulmonary nodularity. IMPRESSION: Motion degraded frontal radiograph. Significantly worsened right-sided aeration, likely due to pleural fluid and airspace disease. Given the appearance on the lateral view, suspect an underlying right-sided hydropneumothorax, with chest tube in place. Worsened left-sided aeration, likely due to atelectasis and underlying pulmonary nodules. Aortic Atherosclerosis (ICD10-I70.0). Electronically Signed   By: Abigail Miyamoto M.D.   On: 01/18/2019 11:08   Ct Angio Chest Pe W/cm &/or Wo Cm  Result Date: 01/29/2019 CLINICAL DATA:  63 year old male with shortness of breath. History of non-small cell lung cancer and malignant pleural effusion. Patient with RIGHT pleural catheter. EXAM: CT ANGIOGRAPHY CHEST WITH CONTRAST TECHNIQUE: Multidetector CT imaging of the chest was performed using the standard protocol during bolus administration of intravenous contrast. Multiplanar CT image reconstructions and MIPs  were obtained to evaluate the vascular anatomy. CONTRAST:  156mL OMNIPAQUE IOHEXOL 350 MG/ML SOLN COMPARISON:  01/06/2019 CT FINDINGS: Cardiovascular: This is a technically satisfactory study. Segmental pulmonary emboli are identified within the LEFT UPPER lobe, lingula and LEFT LOWER lobe. Mild thoracic aortic atherosclerotic calcifications noted without thoracic aneurysm. A trace pericardial effusion is present. Mediastinum/Nodes: No definite enlarged lymph nodes. No mediastinal mass. Lungs/Pleura: A RIGHT pleural catheter is present with an increasing large RIGHT pleural effusion with small to moderate amount of anterior RIGHT pleural air. Near complete compressive atelectasis of the RIGHT lung is now noted. RIGHT pleural enhancement is again noted. A small - moderate  LEFT pleural effusion is now noted. Multiple nodular opacities throughout the visualized LEFT lung are again identified and stable to slightly enlarged. There is no evidence of LEFT pneumothorax. Upper Abdomen: No acute abnormality. Musculoskeletal: Scattered sclerotic lesions within scattered ribs and thoracic spine again noted, some which have increased in size. A 1 cm sclerotic lesion within the T8 vertebral body previously measured 0.5 cm. Review of the MIP images confirms the above findings. IMPRESSION: 1. Multiple LEFT segmental pulmonary emboli. New small to moderate LEFT pleural effusion. 2. Increasing large RIGHT hydropneumothorax (much larger fluid component) with RIGHT pleural catheter in place. Near complete compressive atelectasis of the RIGHT lung now noted. RIGHT pleural enhancement again noted compatible with metastatic disease. 3. Stable to slightly enlarging diffuse nodules throughout the LEFT lung and enlarging sclerotic bony lesions, compatible with increasing metastatic disease. Critical Value/emergent results were called by telephone at the time of interpretation on 01/08/2019 at 1:44 pm to [facility], [facility] , who verbally  acknowledged these results. Electronically Signed   By: Margarette Canada M.D.   On: 01/08/2019 13:48    Pending Labs Unresulted Labs (From admission, onward)    Start     Ordered   02/01/19 0500  CBC  Daily,   R     01/29/2019 1440   01/10/2019 2100  Heparin level (unfractionated)  Once-Timed,   STAT     01/09/2019 1440   01/26/2019 1511  Lactic acid, plasma  Once,   STAT     01/27/2019 1510   01/20/2019 1507  Blood gas, arterial  Once,   STAT     01/08/2019 1506   01/15/2019 1459  SARS Coronavirus 2 [facility] order, Performed in [facility] hospital lab) Nasopharyngeal Nasopharyngeal Swab  (Symptomatic/High Risk/Tier 1)  Once,   STAT    Question Answer Comment  Is this test for diagnosis or screening Diagnosis of ill patient   Symptomatic for COVID-19 as defined by CDC Yes   Date of Symptom Onset 01/30/2019   Hospitalized for COVID-19 Yes   Admitted to ICU for COVID-19 No   Previously tested for COVID-19 Yes   Resident in a congregate (group) care setting No   Employed in healthcare setting No      01/07/2019 1458   01/24/2019 1431  Protime-INR  ONCE - STAT,   STAT     01/14/2019 1430   01/22/2019 1431  APTT  ONCE - STAT,   STAT     01/27/2019 1430          Vitals/Pain Today's Vitals   01/27/2019 1400 01/11/2019 1430 01/04/2019 1501 01/21/2019 1503  BP: (!) 128/97 134/89 (!) 145/100   Pulse: (!) 136 (!) 141 (!) 135 (!) 135  Resp: (!) 35 (!) 36 (!) 44 (!) 43  Temp:      TempSrc:      SpO2: 93% 91% 96% 96%  PainSc:        Isolation Precautions Airborne and Contact precautions  Medications Medications  sodium chloride (PF) 0.9 % injection (has no administration in time range)  heparin bolus via infusion 4,000 Units (4,000 Units Intravenous Bolus from Bag 01/07/2019 1502)    Followed by  heparin ADULT infusion 100 units/mL (25000 units/2110mL sodium chloride 0.45%) (1,700 Units/hr Intravenous New Bag/Given 02/01/2019 1502)  morphine 2 MG/ML injection 1-2 mg (has no administration in time range)  iohexol  (OMNIPAQUE) 350 MG/ML injection 100 mL (100 mLs Intravenous Contrast Given 01/25/2019 1245)  LORazepam (ATIVAN) injection 0.5 mg (0.5 mg Intravenous  Given 01/19/2019 1217)    Mobility walks with person assist

## 2019-01-31 NOTE — ED Notes (Signed)
Spoke to Hospice and hospice updated this RN on families conversation with hospice.

## 2019-01-31 NOTE — Progress Notes (Signed)
Pt made DNR by Dr Melvyn Novas and then made Full Code by Dr Karleen Hampshire, paged Dr Karleen Hampshire to clarify code status. Pt also refusing to wear telemetry leads and O2 probe. Monitor on standby. Waiting for call back.

## 2019-01-31 NOTE — Progress Notes (Addendum)
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 Tomasa Hosteller.  He was experiencing SOB that did not subside.  Hospice offered to send a RN to visit, but due to the noted distress and pt remaining full code, decision was made to activate EMS.  Andrew Davenport is admitted for acute respiratory failure, segmental PE.  This is a related hospital admission.  Spoke with sister [name], she is his [facility] and emergency contact.  Pt lives at home with his mother and another older sister whom [name] is POA for both.  Andrew Davenport has stated he does not want and further cancer treatments.  He does want to treat what can be treated.  He does not want escalation in care, but remains a full code.  Sister [name] is angry that they continue to be contacted by oncologists for treatment when they have been clear they do not want any further cancer treatment.  Unclear if Andrew Davenport feels this way, but [name] states he agrees.   V/S:  97.2 ax, 152/92, HR 127, RR 33, SPO2 89% on 6lpm [place] I&O:  329/295 Lab work:  ABG pH 7.29, CO2 54, O2 71; lactic acid 2.6; Na 131; Cl 89; WBC 20.8 IVs/PRNs:  Heparin @ 1700u/hr; morphine IV 2 mg x 1, morphine IV 4 mg x 1  Problem List: L sided segmental PE- Heparin,O2, morphine IV L sided pleural effusion- O2 R sided hydropneumo- pleurex drained, O2 increased, morphine IV  D/C planning:  Return home with hospice, or [place] if actively dying IDT:  Updated Family:  Extensive discussions with sister [name] GOC:  Unclear, remains full code, questions about oncologist offering new treatment he was considering  Thank you, Venia Carbon RN, BSN, [place] [facility] Liaison (check AMION for daily list of liaisons) [phone]

## 2019-01-31 NOTE — Progress Notes (Signed)
Talked with Dr Wallis Bamberg reference DNR order. Advised him patient will not wear cardiac monitor or sat probe. He is not comfortable with the wires and he states Dr Melvyn Novas stated he did not have to do anything he didn't want.

## 2019-01-31 NOTE — H&P (Signed)
History and Physical    Andrew Davenport QTM:[id number] DOB: 1956/03/21 DOA: 01/10/2019  PCP: Patient, No Pcp Per  Patient coming from: Home  I have personally briefly reviewed patient's old medical records in [place]  Chief Complaint: SOB since this am.   HPI: Andrew Davenport is a 63 y.o. 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 He denies any fevers or chills, reports being nauseated, but no vomiting , or diarrhea or abdominal pain. He complaints of lack of appetite, headache and dizziness but  no dysuria, malena or hematochezia. He denies any syncope, or sensory deficits.   ED Course: on arrival to ED, he was afebrile, tachycardic to 140's, tachypneic in 40/min slightly hypertensive on admission at 145/100 mmhg.  He underwent CT angio of the chest showing multiple left sided segmental PE and New small to moderate LEFT pleural effusion. Increasing large RIGHT hydropneumothorax (much larger fluid component) with RIGHT pleural catheter in place. Near complete compressive atelectasis of the RIGHT lung now noted. RIGHT pleural enhancement again noted compatible with metastatic disease.  Labs are significant for sodium of 131 AG of 17,  Wbc count of 20.8, hemoglobin of 12.2 .  He was referred to medical service for admission for management of PE.    Review of Systems: As per HPI  "All others reviewed and are negative,"   Past Medical History:  Diagnosis Date  . Diabetes mellitus without complication ([place])   . GERD (gastroesophageal reflux disease)     Past Surgical History:  Procedure Laterality Date  . PLEURAL BIOPSY Right 01/08/2019   Procedure: PLEURAL BIOPSY;  Surgeon: [name], MD;  Location: [facility];  Service:  Thoracic;  Laterality: Right;  [name] VIDEO ASSISTED THORACOSCOPY Right 01/08/2019   Procedure: VIDEO ASSISTED THORACOSCOPY;  Surgeon: [name], MD;  Location: [facility];  Service: Thoracic;  Laterality: Right;   Social history:   reports that he has been smoking. He has a 10.75 pack-year smoking history. He has never used smokeless tobacco. He reports that he does not drink alcohol or use drugs.  No Known Allergies  Family history of hypertension and DM present.    Prior to Admission medications   Medication Sig Start Date End Date Taking? Authorizing Provider  metFORMIN (GLUCOPHAGE-XR) 750 MG 24 hr tablet Take 750 mg by mouth daily with breakfast.   Yes [provider]  morphine (MSIR) 15 MG tablet Take 1 tablet (15 mg total) by mouth every 6 (six) hours as needed for severe pain. 01/19/19  Yes Tish Men, MD  Multiple Vitamin (MULTIVITAMIN WITH MINERALS) TABS tablet Take 1 tablet by mouth daily. 01/12/19  Yes Enid Derry, [place], DO  pantoprazole (PROTONIX) 40 MG tablet Take 40 mg by mouth daily.   Yes [provider]  prochlorperazine (COMPAZINE) 10 MG tablet Take 1 tablet (10 mg total) by mouth every 6 (six) hours as needed for nausea or vomiting. 01/19/19  Yes Tish Men, MD  morphine (MS CONTIN) 15 MG 12 hr tablet Take 1 tablet (15 mg total) by mouth every 12 (twelve) hours. 01/28/19 02/27/19  Tish Men, MD  polyethylene glycol (MIRALAX / GLYCOLAX) 17 g packet Take 17 g by mouth daily. Patient not taking: Reported on 01/21/2019 01/12/19  Enid Derry, [place], DO  senna-docusate (SENOKOT-S) 8.6-50 MG tablet Take 1 tablet by mouth at bedtime. Patient not taking: Reported on 01/25/2019 01/12/19   Shirley, [place], DO  temazepam (RESTORIL) 15 MG capsule Take 15 mg by mouth at bedtime. 01/23/19   [provider]    Physical Exam: Vitals:   01/14/2019 1400 01/14/2019 1430 01/06/2019 1501 01/27/2019 1503  BP: (!) 128/97 134/89 (!) 145/100   Pulse: (!) 136 (!) 141 (!) 135 (!) 135   Resp: (!) 35 (!) 36 (!) 44 (!) 43  Temp:      TempSrc:      SpO2: 93% 91% 96% 96%    Constitutional: moderate to severe distress from sob,  Vitals:   01/22/2019 1400 01/30/2019 1430 01/13/2019 1501 01/03/2019 1503  BP: (!) 128/97 134/89 (!) 145/100   Pulse: (!) 136 (!) 141 (!) 135 (!) 135  Resp: (!) 35 (!) 36 (!) 44 (!) 43  Temp:      TempSrc:      SpO2: 93% 91% 96% 96%   ENMT: Mucous membranes are dry  Neck: normal, supple,  Respiratory: left side air entry fair, with coarse breath sounds, tachypnea and accessory muscle use present. Air entry on the right diminished.  Cardiovascular: tachycardic, s1s2 heard. murmers cannot be appreciated.  Abdomen: no tenderness, soft, non distended,  Bowel sounds positive.  Musculoskeletal: right leg more swollen than the left , leg edema 2+. Skin: no rashes, lesions, ulcers. Neurologic: CN 2-12 grossly intact.alert and answering questions appropriately. Able tomove all extremities, gait couldn't be tested. Psychiatric:  Alert and oriented x 3. Anxious, restless.     Labs on Admission: I have personally reviewed following labs and imaging studies  CBC: Recent Labs  Lab 01/27/19 1321 01/23/2019 1032  WBC 11.0* 20.8*  NEUTROABS 7.3  --   HGB 11.0* 12.2*  HCT 34.7* 38.6*  MCV 84.8 86.7  PLT 419* 976*   Basic Metabolic Panel: Recent Labs  Lab 01/27/19 1321 01/20/2019 1032  NA 134* 131*  K 4.1 4.4  CL 95* 89*  CO2 26 25  GLUCOSE 116* 143*  BUN 10 10  CREATININE 0.86 0.78  CALCIUM 9.4 9.2   GFR: Estimated Creatinine Clearance: 127.4 mL/min (by C-G formula based on SCr of 0.78 mg/dL). Liver Function Tests: Recent Labs  Lab 01/27/19 1321 01/06/2019 1032  AST 18 19  ALT 21 22  ALKPHOS 109 101  BILITOT 0.4 0.8  PROT 7.0 7.5  ALBUMIN 3.3* 3.3*   No results for input(s): LIPASE, AMYLASE in the last 168 hours. No results for input(s): AMMONIA in the last 168 hours. Coagulation Profile: No results for input(s): INR, PROTIME in the last  168 hours. Cardiac Enzymes: No results for input(s): CKTOTAL, CKMB, CKMBINDEX, TROPONINI in the last 168 hours. BNP (last 3 results) No results for input(s): PROBNP in the last 8760 hours. HbA1C: No results for input(s): HGBA1C in the last 72 hours. CBG: No results for input(s): GLUCAP in the last 168 hours. Lipid Profile: No results for input(s): CHOL, HDL, LDLCALC, TRIG, CHOLHDL, LDLDIRECT in the last 72 hours. Thyroid Function Tests: No results for input(s): TSH, T4TOTAL, FREET4, T3FREE, THYROIDAB in the last 72 hours. Anemia Panel: No results for input(s): VITAMINB12, FOLATE, FERRITIN, TIBC, IRON, RETICCTPCT in the last 72 hours. Urine analysis:    Component Value Date/Time   COLORURINE YELLOW 01/07/2019 1637   APPEARANCEUR CLEAR 01/07/2019 1637   LABSPEC 1.040 (H) 01/07/2019 1637   PHURINE 5.0 01/07/2019 1637   GLUCOSEU  NEGATIVE 01/07/2019 [place] 01/07/2019 1637   BILIRUBINUR NEGATIVE 01/07/2019 1637   KETONESUR 5 (A) 01/07/2019 1637   PROTEINUR NEGATIVE 01/07/2019 1637   NITRITE NEGATIVE 01/07/2019 1637   LEUKOCYTESUR NEGATIVE 01/07/2019 1637    Radiological Exams on Admission: Dg Chest 2 View  Result Date: 01/14/2019 CLINICAL DATA:  Stage IV lung cancer, on hospice. Fall. Worsening oxygen demands. EXAM: CHEST - 2 VIEW COMPARISON:  01/10/2019.  CT of 01/06/2019. FINDINGS: Motion degraded AP radiograph. The lateral view is of better quality. Patient rotated minimally right. Midline trachea. Normal heart size. Atherosclerosis in the transverse aorta. Tiny left pleural effusion, new. No pneumothorax. Worsened right-sided aeration, with near complete whiteout. Right-sided chest tube in place. Although there is no well-defined pleural air on the frontal view, fluid level on the lateral suggests hydropneumothorax. Worsened left-sided aeration, with interstitial thickening and areas of probable atelectasis and pulmonary nodularity. IMPRESSION: Motion degraded frontal  radiograph. Significantly worsened right-sided aeration, likely due to pleural fluid and airspace disease. Given the appearance on the lateral view, suspect an underlying right-sided hydropneumothorax, with chest tube in place. Worsened left-sided aeration, likely due to atelectasis and underlying pulmonary nodules. Aortic Atherosclerosis (ICD10-I70.0). Electronically Signed   By: Abigail Miyamoto M.D.   On: 01/08/2019 11:08   Ct Angio Chest Pe W/cm &/or Wo Cm  Result Date: 01/05/2019 CLINICAL DATA:  63 year old male with shortness of breath. History of non-small cell lung cancer and malignant pleural effusion. Patient with RIGHT pleural catheter. EXAM: CT ANGIOGRAPHY CHEST WITH CONTRAST TECHNIQUE: Multidetector CT imaging of the chest was performed using the standard protocol during bolus administration of intravenous contrast. Multiplanar CT image reconstructions and MIPs were obtained to evaluate the vascular anatomy. CONTRAST:  165mL OMNIPAQUE IOHEXOL 350 MG/ML SOLN COMPARISON:  01/06/2019 CT FINDINGS: Cardiovascular: This is a technically satisfactory study. Segmental pulmonary emboli are identified within the LEFT UPPER lobe, lingula and LEFT LOWER lobe. Mild thoracic aortic atherosclerotic calcifications noted without thoracic aneurysm. A trace pericardial effusion is present. Mediastinum/Nodes: No definite enlarged lymph nodes. No mediastinal mass. Lungs/Pleura: A RIGHT pleural catheter is present with an increasing large RIGHT pleural effusion with small to moderate amount of anterior RIGHT pleural air. Near complete compressive atelectasis of the RIGHT lung is now noted. RIGHT pleural enhancement is again noted. A small - moderate LEFT pleural effusion is now noted. Multiple nodular opacities throughout the visualized LEFT lung are again identified and stable to slightly enlarged. There is no evidence of LEFT pneumothorax. Upper Abdomen: No acute abnormality. Musculoskeletal: Scattered sclerotic lesions  within scattered ribs and thoracic spine again noted, some which have increased in size. A 1 cm sclerotic lesion within the T8 vertebral body previously measured 0.5 cm. Review of the MIP images confirms the above findings. IMPRESSION: 1. Multiple LEFT segmental pulmonary emboli. New small to moderate LEFT pleural effusion. 2. Increasing large RIGHT hydropneumothorax (much larger fluid component) with RIGHT pleural catheter in place. Near complete compressive atelectasis of the RIGHT lung now noted. RIGHT pleural enhancement again noted compatible with metastatic disease. 3. Stable to slightly enlarging diffuse nodules throughout the LEFT lung and enlarging sclerotic bony lesions, compatible with increasing metastatic disease. Critical Value/emergent results were called by telephone at the time of interpretation on 01/18/2019 at 1:44 pm to [facility], [facility] , who verbally acknowledged these results. Electronically Signed   By: Margarette Canada M.D.   On: 01/23/2019 13:48    EKG: Independently reviewed. Sinus tachycardia with PVC'S.  Assessment/Plan Active Problems:  Acute respiratory failure with hypoxia (HCC)   Acute respiratory failure with hypoxia and hypercapnia/ Acute respiratory acidosis  Secondary to bilateral acute pulmonary embolism and progression of metastatic disease and moderate bilateral effusions.   Admit to step down. Currently requiring f Started the patient on IV heparin. ABG reviewed and BIPAP ordered and he appears to get tired.  IV morphine for pain.  PCCM consulted for assistance with management.  Will get venous duplex to check for bilateral dvt's/ CLOT burden.  Echocardiogram to check for right heart strain.  Please drain from the right pleurex catheter    Metastatic adenocarcinoma of the lung ; - further management as per Dr Maylon Peppers.    Diabetes Mellitus:  CBG (last 3)  No results for input(s): GLUCAP in the last 72 hours. Resume SSI.  Get A1c.     Lactic  acidosis/  ?  Hypoxemia , From respiratory acidosis, . Trend lactate.    Hyponatremia: probably from lung ca and fluid overload.   Leukocytosis and thrombocytosis:  Probably reactive.  Pt denies any fevers or chills.    Anemia of chronic disease/ malignancy.  Monitor.    Severity of Illness: The appropriate patient status for this patient is INPATIENT. Inpatient status is judged to be reasonable and necessary in order to provide the required intensity of service to ensure the patient's safety. The patient's presenting symptoms, physical exam findings, and initial radiographic and laboratory data in the context of their chronic comorbidities is felt to place them at high risk for further clinical deterioration. Furthermore, it is not anticipated that the patient will be medically stable for discharge from the hospital within 2 midnights of admission. The following factors support the patient status of inpatient.   " The patient's presenting symptoms include sob, cough. " The worrisome physical exam findings include hypoxemia requ upto 5 lit of [place] oxygen, tachypnea and tachycardia " The initial radiographic and laboratory data are worrisome because of bilateral PE " The chronic co-morbidities include metastatic adeno ca. Of the lung.    * I certify that at the point of admission it is my clinical judgment that the patient will require inpatient hospital care spanning beyond 2 midnights from the point of admission due to high intensity of service, high risk for further deterioration and high frequency of surveillance required.*    DVT prophylaxis: heparin.  Code Status: full code.  Family Communication: none at bedside Disposition Plan: pending resolution of respiratory issues.  Consults called: Dr Melvyn Novas with Pulmonology Admission status: inpatient/ step down   Hosie Poisson MD [facility] Pager [phone]   If 7PM-7AM, please contact night-coverage [email or web address]  Password [email or web address]  01/05/2019, 3:16 PM

## 2019-01-31 NOTE — ED Notes (Signed)
Patient is at 94% on 4 litters.

## 2019-01-31 NOTE — Progress Notes (Signed)
ANTICOAGULATION CONSULT NOTE - Initial Consult  Pharmacy Consult for Heparin Indication: pulmonary embolus  No Known Allergies  Patient Measurements:   Heparin Dosing Weight: 103 kg  Vital Signs: Temp: 97.6 F (36.4 C) (08/30 1018) Temp Source: Oral (08/30 1018) BP: 128/97 (08/30 1400) Pulse Rate: 136 (08/30 1400)  Labs: Recent Labs    01/10/2019 1032  HGB 12.2*  HCT 38.6*  PLT 444*  CREATININE 0.78    Estimated Creatinine Clearance: 127.4 mL/min (by C-G formula based on SCr of 0.78 mg/dL).   Medical History: Past Medical History:  Diagnosis Date  . Diabetes mellitus without complication ([place])   . GERD (gastroesophageal reflux disease)     Medications:  No anticoagulation PTA  Assessment:  63 yr male presents with shortness of breath.  PMH signficant for stage 4 lung cancer.   CTAngio shows multiple left segmental pulmonary emboli  Pharmacy consulted to dose IV heparin  Goal of Therapy:  Heparin level 0.3-0.7 units/ml Monitor platelets by anticoagulation protocol: Yes   Plan:   Obtain baseline aPTT and PT/INR  Heparin 4000 unit IV bolus x 1 followed by heparin infusion @ 1700 units/hr  Check heparin level 6 hr after heparin started  Follow heparin level and CBC daily while on heparin  Hazael Olveda, Toribio Harbour, PharmD 01/11/2019,2:35 PM

## 2019-01-31 NOTE — Progress Notes (Signed)
eLink Physician-Brief Progress Note Patient Name: Andrew Davenport DOB: 1956-01-24 MRN: [id number]   Date of Service  01/07/2019  HPI/Events of Note  Patient made DNR/DNI by Dr. Melvyn Novas earlier today after extensive conversation with patient. Hospitalist, who was not aware of Dr. Gustavus Bryant order changed the code status to Full Code. In addition, patient is refusing some of the care ordered in the stepdown unit.   eICU Interventions  Will change code status to DNR/DNI.     Intervention Category Major Interventions: End of life / care limitation discussion  Lysle Dingwall 01/07/2019, 8:02 PM

## 2019-01-31 NOTE — ED Notes (Signed)
Patient transported to CT 

## 2019-01-31 NOTE — Progress Notes (Signed)
Talked with sister updates given on care and how patient was doing. I advised the medications I had given him for pain and relaxation. I advised her he would not wear monitor because of the wires and told her I was sitting right by his door so he could just call me. I answered all her questions and assured her we had made her the contact on his chart. She advised she would try and send phone tomorrow if he didn't go home and his daughter would be the visitor after she finished school and work Architectural technologist.

## 2019-01-31 NOTE — ED Notes (Signed)
Pt currently on 4L [place] with 94% sat

## 2019-02-01 ENCOUNTER — Inpatient Hospital Stay (HOSPITAL_COMMUNITY): Payer: No Typology Code available for payment source

## 2019-02-01 ENCOUNTER — Other Ambulatory Visit: Payer: Self-pay

## 2019-02-01 ENCOUNTER — Other Ambulatory Visit: Payer: Self-pay | Admitting: Hematology

## 2019-02-01 DIAGNOSIS — I2699 Other pulmonary embolism without acute cor pulmonale: Secondary | ICD-10-CM

## 2019-02-01 DIAGNOSIS — J910 Malignant pleural effusion: Secondary | ICD-10-CM

## 2019-02-01 DIAGNOSIS — C3491 Malignant neoplasm of unspecified part of right bronchus or lung: Secondary | ICD-10-CM | POA: Diagnosis not present

## 2019-02-01 LAB — ECHOCARDIOGRAM COMPLETE
Height: 73 in
Weight: 3774.28 oz

## 2019-02-01 LAB — CBC
HCT: 34.7 % — ABNORMAL LOW (ref 39.0–52.0)
Hemoglobin: 10.8 g/dL — ABNORMAL LOW (ref 13.0–17.0)
MCH: 26.9 pg (ref 26.0–34.0)
MCHC: 31.1 g/dL (ref 30.0–36.0)
MCV: 86.5 fL (ref 80.0–100.0)
Platelets: 387 10*3/uL (ref 150–400)
RBC: 4.01 MIL/uL — ABNORMAL LOW (ref 4.22–5.81)
RDW: 14.9 % (ref 11.5–15.5)
WBC: 19.6 10*3/uL — ABNORMAL HIGH (ref 4.0–10.5)
nRBC: 0 % (ref 0.0–0.2)

## 2019-02-01 LAB — HEMOGLOBIN A1C
Hgb A1c MFr Bld: 6.6 % — ABNORMAL HIGH (ref 4.8–5.6)
Mean Plasma Glucose: 142.72 mg/dL

## 2019-02-01 LAB — COMPREHENSIVE METABOLIC PANEL
ALT: 20 U/L (ref 0–44)
AST: 19 U/L (ref 15–41)
Albumin: 2.9 g/dL — ABNORMAL LOW (ref 3.5–5.0)
Alkaline Phosphatase: 95 U/L (ref 38–126)
Anion gap: 16 — ABNORMAL HIGH (ref 5–15)
BUN: 12 mg/dL (ref 6–20)
CO2: 27 mmol/L (ref 22–32)
Calcium: 9.3 mg/dL (ref 8.9–10.3)
Chloride: 90 mmol/L — ABNORMAL LOW (ref 98–111)
Creatinine, Ser: 0.65 mg/dL (ref 0.61–1.24)
GFR calc Af Amer: >60 mL/min (ref >60)
GFR calc non Af Amer: >60 mL/min (ref >60)
Glucose, Bld: 144 mg/dL — ABNORMAL HIGH (ref 70–99)
Potassium: 4.8 mmol/L (ref 3.5–5.1)
Sodium: 133 mmol/L — ABNORMAL LOW (ref 135–145)
Total Bilirubin: 0.6 mg/dL (ref 0.3–1.2)
Total Protein: 7.1 g/dL (ref 6.5–8.1)

## 2019-02-01 LAB — BLOOD GAS, VENOUS
Acid-Base Excess: 4.3 mmol/L — ABNORMAL HIGH (ref 0.0–2.0)
Acid-base deficit: 4.8 mmol/L — ABNORMAL HIGH (ref 0.0–2.0)
Bicarbonate: 29.5 mmol/L — ABNORMAL HIGH (ref 20.0–28.0)
O2 Saturation: 62.8 %
pCO2, Ven: 49.5 mmHg (ref 44.0–60.0)
pH, Ven: 7.393 (ref 7.250–7.430)
pO2, Ven: 35.5 mmHg (ref 32.0–45.0)

## 2019-02-01 LAB — LACTIC ACID, PLASMA
Lactic Acid, Venous: 2.5 mmol/L (ref 0.5–1.9)
Lactic Acid, Venous: 1.4 mmol/L (ref 0.5–1.9)

## 2019-02-01 LAB — HEPARIN LEVEL (UNFRACTIONATED)
Heparin Unfractionated: 0.28 IU/mL — ABNORMAL LOW (ref 0.30–0.70)
Heparin Unfractionated: 0.13 IU/mL — ABNORMAL LOW (ref 0.30–0.70)

## 2019-02-01 MED ORDER — SODIUM CHLORIDE 0.9% FLUSH: 10.0000 mL | INTRAVENOUS | Status: DC | PRN

## 2019-02-01 MED ORDER — CHLORHEXIDINE GLUCONATE CLOTH 2 % EX PADS
6.0000 | MEDICATED_PAD | Freq: Every day | CUTANEOUS | Status: DC
  Administered 2019-02-01 – 2019-02-04 (×3): 6 via TOPICAL

## 2019-02-01 MED ORDER — HEPARIN BOLUS VIA INFUSION
2000.0000 [IU] | Freq: Once | INTRAVENOUS | Status: AC
  Administered 2019-02-01: 2000 [IU] via INTRAVENOUS
  Filled 2019-02-01: qty 2000

## 2019-02-01 NOTE — Progress Notes (Signed)
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 Wagster is under hospice services for lung cancer per Dr. Tomasa Hosteller.  He was experiencing SOB that did not subside.  Hospice offered to send a RN to visit, but due to the noted distress and pt remaining full code, decision was made to activate EMS.  Mr. Sachse is admitted for acute respiratory failure, segmental PE.  This is a related hospital admission.  Patient has declined to wear cardiac monitor and O2 prob. He has also removed his IV and refusing some medications.  Patient is now a DNR/DNI per Dr. Melvyn Novas.  VS:  98.1, 124/99, 80, 20, 99% (when agrees to wear prob) on HFNC 10l currently I:O 411.02/1049  Labs: Acid base excess ven 4.3, Acid base deficit ven 4.8, Bicarb ven 29.5,  Na 133, Chloride 90, Glucose 144, Anion gap 16, Albumin 2.9, WBC 19.6, RBC 4.01, Hgb 10.8, HCT 34.7  Medications: Heparin 1900 units/hour, morphine 4mg  PRN @ 0501,  Morphine scheduled 15mg  PO BID, DuoNeb 0.5-2.5mg  nebulizer Q6hrs.  No new MD notes other than to address code status change.   D/C planning:  Return home with hospice, or [place] if actively dying.  IDT:  Updated Family:  Spoke with sister [place] who states she spoke with patient this am. GOC:  Code status now DNR/DNI  Thank you, Farrel Gordon, RN, CCM [place] (check AMION for daily list of liaisons) [phone]

## 2019-02-01 NOTE — Progress Notes (Signed)
ANTICOAGULATION CONSULT NOTE - Follow Up Consult  Pharmacy Consult for Heparin Indication: pulmonary embolus  No Known Allergies  Patient Measurements: Height: 6\' 1"  (185.4 cm) Weight: 235 lb 14.3 oz (107 kg) IBW/kg (Calculated) : 79.9 Heparin Dosing Weight:   Vital Signs: Temp: 97.2 F (36.2 C) (08/30 1700) Temp Source: Axillary (08/30 1840) BP: 139/89 (08/30 2310) Pulse Rate: 101 (08/30 2315)  Labs: Recent Labs    01/04/2019 1032 01/18/2019 1459 01/02/2019 2039  HGB 12.2*  --   --   HCT 38.6*  --   --   PLT 444*  --   --   APTT  --  32  --   LABPROT  --  16.5*  --   INR  --  1.3*  --   HEPARINUNFRC  --   --  <0.10*  CREATININE 0.78  --   --     Estimated Creatinine Clearance: 126 mL/min (by C-G formula based on SCr of 0.78 mg/dL).   Medications:  Infusions:  . heparin Stopped (02/01/19 0230)    Assessment: Patient with heparin level below goal.  RN reports no issues per heparin drip before level drawn, but that patient did pull IV line out after.  Heparin restart time noted.  However, patient pulled IV line out again ~0230---will follow up with RN as to if/when heparin restarted again  Goal of Therapy:  Heparin level 0.3-0.7 units/ml Monitor platelets by anticoagulation protocol: Yes   Plan:  Increase heparin to 1900 units/hr Recheck level ~6-8 hours after heparin restarted.   Tyler Deis, Shea Stakes [place] 02/01/2019,3:21 AM

## 2019-02-01 NOTE — Progress Notes (Signed)
2135 Patient has pleurex drain to R chest that he empties each night 2100-2200. Notified on call Dr. Maudie Mercury, he gave a verbal that it was fine to drain as patient does at home. Charge nurse in to educate, assisted patient, drained 342mL Andrew Davenport/green liquid return. Patient tolerated well. Charge nurse clamped and dressed per protocol.

## 2019-02-01 NOTE — Progress Notes (Signed)
Patient continues to be non-compliant with wearing tele monitor, O2 sat probe, and BP cuff. Allowed for VS earlier in the shift. Tele monitoring notified by charge nurse. Reported by primary nurse during shift report that patient has refused some elements of care since admission. Refused side rails to be up. Educated on safety risk, patient verbalized understanding. Refused restoril. Refused NT care at times.

## 2019-02-01 NOTE — Progress Notes (Signed)
Midline placed by IV team, condom catheter placed at patient request, and Morphine given for pain. Expained the extreme importance of not trying to get out of bed and NOT pull this IV out. Rails up and bed alarm on.Urinal removed so he would not be confused and think he needed to use that. Call bell and suction by his hands. Encouraged to call with any needs. Sleeping and appears comfortable now.

## 2019-02-01 NOTE — Progress Notes (Signed)
Pt. Refused use of BiPAP @ this time, RT to monitor.

## 2019-02-01 NOTE — Progress Notes (Addendum)
VASCULAR LAB PRELIMINARY  PRELIMINARY  PRELIMINARY  PRELIMINARY  Bilateral lower extremity venous duplex completed.    Preliminary report:  See CV proc for preliminary results.  Rudi Coco, RN results.   Kasiya Burck, RVT 02/01/2019, 9:06 AM

## 2019-02-01 NOTE — Progress Notes (Signed)
Patient stable, spoke with primary, PCCM will sign off  Rush Farmer, M.D. [facility] Pulmonary/Critical Care Medicine. Pager: [phone]. After hours pager: [phone].

## 2019-02-01 NOTE — Progress Notes (Signed)
Andrew Davenport   DOB:December 17, 1955   RS#:[id number]    HEME/ONC OVERVIEW: 1. 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, PD-L1 10% -I had a very lengthy discussion with the patient during the last clinic visit regarding the rationale for chemotherapy, as well as some of the potential side effects of treatment -Patient was undecided at that time and indicated that he would like some time to discuss with his family before making a decision regarding treatment -Today, he confirmed his decision that he did not wish to pursue any treatment, and would like to continue hospice -I informed the patient that I respected his decision and thought that it was not unreasonable, given his progressive dyspnea and declining functional status -I have canceled all of his outpatient chemotherapy treatment as well as port placement  Left-sided PTE and bilateral LE DVT -Currently on heparin ggt  -Okay to transition to a DOAC at the time of discharge  -I will defer further management to the hospitalist team   Malignant right pleural effusion -Secondary to metastatic lung cancer -Continue drainage of the Pleurx catheter as needed  Cancer-related pain -Secondary to Pleurx catheter and pleural involvement by lung cancer -Pain currently well controlled with MS-Contin and IR morphine as needed -Continue pain  management per hospice service  Thank you for caring for our mutual patient. Oncology is available as needed.  Andrew Men, MD 02/01/2019  2:44 PM  Subjective:  Mr. Andrew Davenport reports that he still has moderate persistent shortness of breath despite Pleurx catheter drainage.  He also has persistent bilateral lower extremity swelling from acute DVT.  He had a chance to discuss his diagnosis in detail with his family members, and ultimately made the decision not to pursue any palliative treatment.  He is at the peace with his diagnosis and will move forward with hospice as already established.  His pain is much better controlled since starting MS-Contin.  He denies any other complaint.  ROS: Constitutional: ( - ) fevers, ( - )  chills , ( - ) night sweats Ears, nose, mouth, throat, and face: ( - ) mucositis, ( - ) sore throat Respiratory: ( + ) cough, ( + ) dyspnea, ( - ) wheezes Cardiovascular: ( - ) palpitation, ( - ) chest discomfort, ( + ) lower extremity swelling Gastrointestinal:  ( - ) nausea, ( - ) heartburn, ( - ) change in bowel habits Skin: ( - ) abnormal skin rashes Behavioral/Psych: ( - ) mood change, ( - ) new changes  All other systems were reviewed with the patient and are negative.  Objective:  Vitals:   02/01/19 0900 02/01/19 1230  BP: (!) 124/99   Pulse: 80   Resp: 20   Temp:  98.1 F (36.7 C)  SpO2:       Intake/Output Summary (Last 24 hours) at 02/01/2019 1444 Last data filed at 02/01/2019 1230 Gross per 24 hour  Intake 507.52 ml  Output 1250 ml  Net -742.48  ml    GENERAL: alert, no distress, dyspneic with speaking short sentences  SKIN: skin color, texture, turgor are normal, no rashes or significant lesions EYES: conjunctiva are pink and non-injected, sclera clear NECK: supple, non-tender LUNGS: decreased air movement in the right lung, faint bibasilar wheezing  HEART: regular rate & rhythm and no murmurs and 2+ bilateral lower extremity edema ABDOMEN: soft,  non-tender, non-distended, normal bowel sounds PSYCH: alert & oriented, speaking in short sentences due to dyspnea    Labs:  Lab Results  Component Value Date   WBC 19.6 (H) 02/01/2019   HGB 10.8 (L) 02/01/2019   HCT 34.7 (L) 02/01/2019   MCV 86.5 02/01/2019   PLT 387 02/01/2019   NEUTROABS 7.3 01/27/2019    Lab Results  Component Value Date   NA 133 (L) 02/01/2019   K 4.8 02/01/2019   CL 90 (L) 02/01/2019   CO2 27 02/01/2019    Studies:  Dg Chest 2 View  Result Date: 01/11/2019 CLINICAL DATA:  Stage IV lung cancer, on hospice. Fall. Worsening oxygen demands. EXAM: CHEST - 2 VIEW COMPARISON:  01/10/2019.  CT of 01/06/2019. FINDINGS: Motion degraded AP radiograph. The lateral view is of better quality. Patient rotated minimally right. Midline trachea. Normal heart size. Atherosclerosis in the transverse aorta. Tiny left pleural effusion, new. No pneumothorax. Worsened right-sided aeration, with near complete whiteout. Right-sided chest tube in place. Although there is no well-defined pleural air on the frontal view, fluid level on the lateral suggests hydropneumothorax. Worsened left-sided aeration, with interstitial thickening and areas of probable atelectasis and pulmonary nodularity. IMPRESSION: Motion degraded frontal radiograph. Significantly worsened right-sided aeration, likely due to pleural fluid and airspace disease. Given the appearance on the lateral view, suspect an underlying right-sided hydropneumothorax, with chest tube in place. Worsened left-sided aeration, likely due to atelectasis and underlying pulmonary nodules. Aortic Atherosclerosis (ICD10-I70.0). Electronically Signed   By: Abigail Miyamoto M.D.   On: 01/22/2019 11:08   Ct Angio Chest Pe W/cm &/or Wo Cm  Result Date: 01/05/2019 CLINICAL DATA:  63 year old male with shortness of breath. History of non-small cell lung cancer and malignant pleural effusion. Patient with RIGHT pleural catheter. EXAM: CT ANGIOGRAPHY CHEST  WITH CONTRAST TECHNIQUE: Multidetector CT imaging of the chest was performed using the standard protocol during bolus administration of intravenous contrast. Multiplanar CT image reconstructions and MIPs were obtained to evaluate the vascular anatomy. CONTRAST:  150m OMNIPAQUE IOHEXOL 350 MG/ML SOLN COMPARISON:  01/06/2019 CT FINDINGS: Cardiovascular: This is a technically satisfactory study. Segmental pulmonary emboli are identified within the LEFT UPPER lobe, lingula and LEFT LOWER lobe. Mild thoracic aortic atherosclerotic calcifications noted without thoracic aneurysm. A trace pericardial effusion is present. Mediastinum/Nodes: No definite enlarged lymph nodes. No mediastinal mass. Lungs/Pleura: A RIGHT pleural catheter is present with an increasing large RIGHT pleural effusion with small to moderate amount of anterior RIGHT pleural air. Near complete compressive atelectasis of the RIGHT lung is now noted. RIGHT pleural enhancement is again noted. A small - moderate LEFT pleural effusion is now noted. Multiple nodular opacities throughout the visualized LEFT lung are again identified and stable to slightly enlarged. There is no evidence of LEFT pneumothorax. Upper Abdomen: No acute abnormality. Musculoskeletal: Scattered sclerotic lesions within scattered ribs and thoracic spine again noted, some which have increased in size. A 1 cm sclerotic lesion within the T8 vertebral body previously measured 0.5 cm. Review of the MIP images confirms the above findings. IMPRESSION: 1. Multiple LEFT segmental pulmonary emboli. New small to  moderate LEFT pleural effusion. 2. Increasing large RIGHT hydropneumothorax (much larger fluid component) with RIGHT pleural catheter in place. Near complete compressive atelectasis of the RIGHT lung now noted. RIGHT pleural enhancement again noted compatible with metastatic disease. 3. Stable to slightly enlarging diffuse nodules throughout the LEFT lung and enlarging sclerotic bony  lesions, compatible with increasing metastatic disease. Critical Value/emergent results were called by telephone at the time of interpretation on 01/30/2019 at 1:44 pm to [facility] , who verbally acknowledged these results. Electronically Signed   By: Margarette Canada M.D.   On: 01/19/2019 13:48   Vas [place] Lower Extremity Venous (dvt)  Result Date: 02/01/2019  Lower Venous Study Indications: Swelling, SOB, and pulmonary embolism.  Risk Factors: Cancer Metastatic adneocarcinoma. Comparison Study: No prior study on file for comparison Performing Technologist: Sharion Dove RVS  Examination Guidelines: A complete evaluation includes B-mode imaging, spectral Doppler, color Doppler, and power Doppler as needed of all accessible portions of each vessel. Bilateral testing is considered an integral part of a complete examination. Limited examinations for reoccurring indications may be performed as noted.  +---------+---------------+---------+-----------+----------+--------------+ RIGHT    CompressibilityPhasicitySpontaneityPropertiesThrombus Aging +---------+---------------+---------+-----------+----------+--------------+ CFV      Partial        Yes      Yes                  Acute          +---------+---------------+---------+-----------+----------+--------------+ SFJ      Full                                                        +---------+---------------+---------+-----------+----------+--------------+ FV Prox  Full                                                        +---------+---------------+---------+-----------+----------+--------------+ FV Mid   Full                                                        +---------+---------------+---------+-----------+----------+--------------+ FV DistalFull                                                        +---------+---------------+---------+-----------+----------+--------------+ PFV      Full                                                         +---------+---------------+---------+-----------+----------+--------------+ POP      Full           Yes      Yes                                 +---------+---------------+---------+-----------+----------+--------------+  PTV      None                                         Acute          +---------+---------------+---------+-----------+----------+--------------+ PERO     None                                         Acute          +---------+---------------+---------+-----------+----------+--------------+ Gastroc  None                                         Acute          +---------+---------------+---------+-----------+----------+--------------+   +---------+---------------+---------+-----------+----------+--------------+ LEFT     CompressibilityPhasicitySpontaneityPropertiesThrombus Aging +---------+---------------+---------+-----------+----------+--------------+ CFV      Full           Yes      Yes                                 +---------+---------------+---------+-----------+----------+--------------+ SFJ      Full                                                        +---------+---------------+---------+-----------+----------+--------------+ FV Prox  Full                                                        +---------+---------------+---------+-----------+----------+--------------+ FV Mid   Full                                                        +---------+---------------+---------+-----------+----------+--------------+ FV DistalFull                                                        +---------+---------------+---------+-----------+----------+--------------+ PFV      Full                                                        +---------+---------------+---------+-----------+----------+--------------+ POP      Partial        No       No                   Acute           +---------+---------------+---------+-----------+----------+--------------+ PTV  None                                         Acute          +---------+---------------+---------+-----------+----------+--------------+ PERO     None                                         Acute          +---------+---------------+---------+-----------+----------+--------------+     Summary: Right: Findings consistent with acute deep vein thrombosis involving the right common femoral vein, right posterior tibial veins, right peroneal veins, and right gastrocnemius veins. Left: Findings consistent with acute deep vein thrombosis involving the left popliteal vein, left posterior tibial veins, and left peroneal veins.  *See table(s) above for measurements and observations. Electronically signed by Deitra Mayo MD on 02/01/2019 at 9:30:09 AM.    Final

## 2019-02-01 NOTE — Progress Notes (Signed)
  Echocardiogram 2D Echocardiogram has been performed.  Matilde Bash 02/01/2019, 9:08 AM

## 2019-02-01 NOTE — Progress Notes (Addendum)
ANTICOAGULATION CONSULT NOTE - Initial Consult  Pharmacy Consult for Heparin Indication: pulmonary embolus  No Known Allergies  Patient Measurements: Height: 6\' 1"  (185.4 cm) Weight: 235 lb 14.3 oz (107 kg) IBW/kg (Calculated) : 79.9 Heparin Dosing Weight: 103 kg  Vital Signs: Temp: 99.1 F (37.3 C) (08/31 0839) Temp Source: Oral (08/31 0839) BP: 124/99 (08/31 0900) Pulse Rate: 80 (08/31 0900)  Labs: Recent Labs    01/18/2019 1032 01/19/2019 1459 01/24/2019 2039 02/01/19 0220  HGB 12.2*  --   --  10.8*  HCT 38.6*  --   --  34.7*  PLT 444*  --   --  387  APTT  --  32  --   --   LABPROT  --  16.5*  --   --   INR  --  1.3*  --   --   HEPARINUNFRC  --   --  <0.10*  --   CREATININE 0.78  --   --  0.65    Estimated Creatinine Clearance: 126 mL/min (by C-G formula based on SCr of 0.65 mg/dL).   Medical History: Past Medical History:  Diagnosis Date  . Diabetes mellitus without complication ([place])   . GERD (gastroesophageal reflux disease)    Medications:  No anticoagulation PTA  Assessment: 63 yr male presents with shortness of breath.  PMH signficant for stage 4 lung cancer. CTAngio shows multiple left segmental pulmonary emboli. Pharmacy consulted to dose IV heparin, 4000 unit bolus, 1700 units/  Today, 02/01/2019 AM hep level low, rate increased, patient pulled IV out x2, last at 0230 Heparin resumed at 0521 at 1900 units/hr  Goal of Therapy:  Heparin level 0.3-0.7 units/ml Monitor platelets by anticoagulation protocol: Yes   Plan:   Heparin at 1900 units/hr  Follow up Hep level at 2p  Follow heparin level and CBC daily while on heparin  Minda Ditto PharmD Pager [phone] 02/01/2019, 10:59 AM  Addendum 1430 Follow up Heparin level 0.28 units/ml on 1900 units/hr RLE DVT per doppler Increase Heparin to 2150 units/hr Recheck level in 6 hr (2100)  Minda Ditto PharmD Pager [phone] 02/01/2019, 2:29 PM

## 2019-02-01 NOTE — Progress Notes (Signed)
Manufacturing engineer SW Telephonic Visit as SW unable to visit due to current hospital COVID-19 visitor restriction policy in place  Purpose of telephonic visit: POC goals/discharge planning.  Social Worker interventions provided:   ADDENDUM: 01:21pm. LCSW called Pt Andrew Davenport Transitions of Care Case Manager, Alinda Sierras, and provided update from LCSW phone call with Pt. She confirmed lack of discharge date for Pt yet/working on blood clot at this time. LCSW noted Pt desire to explore facility placement based on needs and Alinda Sierras notes lack of insurance/LCSW noted Pt has [employer] and they have facilities LCSW believes. Alinda Sierras agreed to work on placement for Pt. LCSW inquired about any confusion for Pt based on his response re: code status inquiry (LCSW noted not specifically asking about DNR but asked about a change in his status). She notes not seeing Pt yet/is uncertain. LCSW agreed to be following up with Pt/encouraged follow up with LCSW as needed and verified Alinda Sierras has LCSW cell phone number.  ADDENDUM: 01:15pm. LCSW then alerted clinical RN Liaison of Pt wanting to explore facility placement post discharge/she provided LCSW with contact information for Lina Sayre Transitions of Care Case Manager [phone]).   01:06pm. LCSW first called and spoke with Pt in his hospital room. He denies pain and states having pain medication/was pleasant/notes having clot in leg/Running IV since yesterday to break up the clot he believes. He states that he fell on Sunday/got into his chair and at 8:00, could not breath. He states calling hospice but ended up calling [phone] so that he could have help faster. LCSW inquired about Pt code status changing and he notes that he may have updated it but was not sure. He states belief that his blood clots are causing more SOB/his disease is progressing. LCSW inquired about his desire to return home once discharged/Pt states having trouble walking and notes desire  for facility care. LCSW informed Pt of inability to go to [facility] unless [place] approves his transfer when he is eligible to go. LCSW agreed to alert hospital SW of Pt desire to explore facility placement post discharge. LCSW inquired about his status re: change in condition and he notes that his going to keep on keeping on. LCSW agreed to follow up with him.  Follow up planned by social worker: LCSW to assist as needed with hospital discharge planning and POC goals clarification. LCSW agreed to call Pt sister when talking with Pt and will do so at later time.    Please feel free to call us with any questions or concerns.  Thank you,  Christena Deem, LCSW Licensed Clinical Therapist, art [phone]

## 2019-02-01 NOTE — Progress Notes (Signed)
ANTICOAGULATION CONSULT NOTE -  Consult  Pharmacy Consult for Heparin Indication: pulmonary embolus  No Known Allergies  Patient Measurements: Height: 6\' 1"  (185.4 cm) Weight: 235 lb 14.3 oz (107 kg) IBW/kg (Calculated) : 79.9 Heparin Dosing Weight: 103 kg  Vital Signs: Temp: 98 F (36.7 C) (08/31 2000) Temp Source: Oral (08/31 2000) BP: 142/97 (08/31 2052) Pulse Rate: 88 (08/31 2052)  Labs: Recent Labs    01/06/2019 1032 01/14/2019 1459 01/19/2019 2039 02/01/19 0220 02/01/19 1331 02/01/19 2147  HGB 12.2*  --   --  10.8*  --   --   HCT 38.6*  --   --  34.7*  --   --   PLT 444*  --   --  387  --   --   APTT  --  32  --   --   --   --   LABPROT  --  16.5*  --   --   --   --   INR  --  1.3*  --   --   --   --   HEPARINUNFRC  --   --  <0.10*  --  0.28* 0.13*  CREATININE 0.78  --   --  0.65  --   --     Estimated Creatinine Clearance: 126 mL/min (by C-G formula based on SCr of 0.65 mg/dL).   Medical History: Past Medical History:  Diagnosis Date  . Diabetes mellitus without complication ([place])   . GERD (gastroesophageal reflux disease)    Medications:  No anticoagulation PTA  Assessment: 63 yr male presents with shortness of breath.  PMH signficant for stage 4 lung cancer. CTAngio shows multiple left segmental pulmonary emboli. Pharmacy consulted to dose IV heparin, 4000 unit bolus, 1700 units/  Today, 02/01/2019 AM hep level low, rate increased, patient pulled IV out x2, last at 0230 Heparin resumed at 0521 at 1900 units/hr 2147 HL = 0.13 units/hr- patient has been pulling IV out and RN stopped at 2130 for occluded line. HL was drawn soon after. No bleeding noted by RN.   Goal of Therapy:  Heparin level 0.3-0.7 units/ml Monitor platelets by anticoagulation protocol: Yes   Plan:   Will give 2000 unit rebolus now  Leave heparin drip at 2150 units/hr- HL not accurate d/t heparin being off prior to level  Follow heparin level and CBC daily while on  heparin   [name] 02/01/2019, 10:40 PM

## 2019-02-01 NOTE — Progress Notes (Signed)
Late entry. 2100 midline occluded, stopped heparin drip, notified IV consult. IV team assessed, 2130 restarted heparin drip without difficulty.

## 2019-02-01 NOTE — Progress Notes (Addendum)
PROGRESS NOTE    Andrew [place]  HDQ:[id number] DOB: 1956/01/22 DOA: 01/18/2019 PCP: Patient, No Pcp Per   Brief Narrative:  Andrew [facility] is Andrew [place] 63 y.o. 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 He denies any fevers or chills, reports being nauseated, but no vomiting , or diarrhea or abdominal pain. He complaints of lack of appetite, headache and dizziness but  no dysuria, malena or hematochezia. He denies any syncope, or sensory deficits.   ED Course: on arrival to ED, he was afebrile, tachycardic to 140's, tachypneic in 40/min slightly hypertensive on admission at 145/100 mmhg.  He underwent CT angio of the chest showing multiple left sided segmental PE and New small to moderate LEFT pleural effusion. Increasing large RIGHT hydropneumothorax (much larger fluid component) with RIGHT pleural catheter in place. Near complete compressive atelectasis of the RIGHT lung now noted. RIGHT pleural enhancement again noted compatible with metastatic disease.  Labs are significant for sodium of 131 AG of 17,  Wbc count of 20.8, hemoglobin of 12.2 .  He was referred to medical service for admission for management of PE.   Assessment & Plan:   Active Problems:   Malignant pleural effusion   Hypoxemia   Hyponatremia   Type 2 diabetes mellitus with chronic kidney disease, without long-term current use of insulin (HCC)   Adenocarcinoma of lung (HCC)   Leukocytosis   Normocytic anemia   Thrombocytosis (HCC)   Acute respiratory failure with hypoxia (HCC)   Acute pulmonary embolus (HCC)   PE (pulmonary thromboembolism) ([place])  Goals of Care: pt DNR after discussion with pulm yesterday.  He's not interested in additional cancer treatments, but expressed  to me he was interested in treatment for his PE.  Will continue heparin at this time.  I will need to discuss with hospice whether he'll be able to get doac with them.  He's spoken to palliative care here before and isn't interested in another conversation at this time.   Will continue [place] discussions  Acute Hypoxic Respiratory Failure and Acute Hypercarbic Respiratory Failure   Metastatic Lung Cancer   Submassive Pulmonary Embolism Likely related to PE was well as small to moderate L effusion and increasing R hydropneumothorax Continue drainage of pleurx daily Consider thoracentesis of L side Continue heparin GTT Echo with EF 55-60%, impaired relaxation - he has evidence of increased RV pressure with D shaped IV septum (see report) and mildly reduced systolic function LE [place] with bilateral LE DVT's Requiring 10 L high flow, BP is normal and HR improved.  Will continue to monitor with anticoagulation. Pulm c/s - now signed off (discussed possibility of catheter directed thrombolytics, but they noted he would not be candidate)  Metastatic adenocarcinoma of the lung  - further management as per Dr Maylon Peppers - he's no longer interested in chemotherapy - Dr. Maylon Peppers recommending doac at time of discharge - appreciate Dr. Lorette Ang assistance  Diabetes Mellitus:  Resume SSI.  Get A1c.   Lactic acidosis/  improved  Hyponatremia: mild, continue to monitor  Leukocytosis and thrombocytosis:  Probably reactive.  Pt denies any fevers or chills.   Anemia of chronic disease/ malignancy.  Monitor.   DVT prophylaxis: heparin gtt Code Status: DNR Family Communication: none at bedside Disposition Plan:  pending   Consultants:   Oncology  Pulmonology  Procedures:  Echo IMPRESSIONS    1. The left ventricle has normal systolic function, with an ejection fraction of 55-60%. The cavity size was normal. Left ventricular diastolic Doppler parameters are consistent with impaired relaxation. No  evidence of left ventricular regional wall  motion abnormalities.  2. The right ventricle has mildly reduced systolic function. The cavity was moderately enlarged. There is no increase in right ventricular wall thickness. D-shaped interventricular septum suggests RV pressure/volume overload.  3. Trivial pericardial effusion is present.  4. The aortic valve is tricuspid. Mild calcification of the aortic valve. No stenosis of the aortic valve.  5. The aortic root is normal in size and structure.  6. The inferior vena cava was normal in size with <50% respiratory variability. Andrew [place] systolic pressure 43 mmHg.  7. No evidence of mitral valve stenosis. No significant mitral regurgitation.  Antimicrobials:  Anti-infectives (From admission, onward)   None     Subjective: C/o SOB Notes wants to treat treatable Goal is d/c with hospice  Objective: Vitals:   02/01/19 0839 02/01/19 0900 02/01/19 1230 02/01/19 1600  BP:  (!) 124/99    Pulse:  80    Resp:  20    Temp: 99.1 F (37.3 C)  98.1 F (36.7 C) 97.8 F (36.6 C)  TempSrc: Oral  Oral Oral  SpO2:      Weight:      Height:        Intake/Output Summary (Last 24 hours) at 02/01/2019 1813 Last data filed at 02/01/2019 1230 Gross per 24 hour  Intake 177.86 ml  Output 625 ml  Net -447.14 ml   Filed Weights   01/21/2019 1840  Weight: 107 kg    Examination:  General: No acute distress. Cardiovascular: Heart sounds show Andrew [place] regular rate, and rhythm.  Lungs: slightly increased wob, on 10 L HF. Abdomen: Soft, nontender, nondistended  Neurological: Alert and oriented 3. Moves all extremities 4 . Cranial nerves II through XII grossly intact. Skin: Warm and dry. No rashes or lesions. Extremities: No clubbing or cyanosis. No edema.   Data Reviewed: I have personally reviewed following labs and imaging studies  CBC: Recent Labs  Lab 01/27/19 1321 01/28/2019 1032 02/01/19 0220  WBC 11.0* 20.8* 19.6*  NEUTROABS 7.3  --   --   HGB 11.0*  12.2* 10.8*  HCT 34.7* 38.6* 34.7*  MCV 84.8 86.7 86.5  PLT 419* 444* 300   Basic Metabolic Panel: Recent Labs  Lab 01/27/19 1321 01/17/2019 1032 02/01/19 0220  NA 134* 131* 133*  K 4.1 4.4 4.8  CL 95* 89* 90*  CO2 26 25 27   GLUCOSE 116* 143* 144*  BUN 10 10 12   CREATININE 0.86 0.78 0.65  CALCIUM 9.4 9.2 9.3   GFR: Estimated Creatinine Clearance: 126 mL/min (by C-G formula based on SCr of 0.65 mg/dL). Liver Function Tests: Recent Labs  Lab 01/27/19 1321 01/15/2019 1032 02/01/19 0220  AST 18 19 19   ALT 21 22 20   ALKPHOS 109 101 95  BILITOT 0.4 0.8 0.6  PROT 7.0 7.5 7.1  ALBUMIN 3.3* 3.3* 2.9*   No results for input(s): LIPASE, AMYLASE in the last 168 hours. No results for input(s): AMMONIA in the last 168 hours. Coagulation Profile: Recent Labs  Lab 01/06/2019 1459  INR 1.3*   Cardiac Enzymes: No results for input(s): CKTOTAL, CKMB, CKMBINDEX, TROPONINI in the last 168 hours. BNP (last 3 results) No results for input(s): PROBNP in the  last 8760 hours. HbA1C: No results for input(s): HGBA1C in the last 72 hours. CBG: No results for input(s): GLUCAP in the last 168 hours. Lipid Profile: No results for input(s): CHOL, HDL, LDLCALC, TRIG, CHOLHDL, LDLDIRECT in the last 72 hours. Thyroid Function Tests: No results for input(s): TSH, T4TOTAL, FREET4, T3FREE, THYROIDAB in the last 72 hours. Anemia Panel: No results for input(s): VITAMINB12, FOLATE, FERRITIN, TIBC, IRON, RETICCTPCT in the last 72 hours. Sepsis Labs: Recent Labs  Lab 01/08/2019 1525 01/19/2019 2323 02/01/19 1200  LATICACIDVEN 2.6* 2.5* 1.4    Recent Results (from the past 240 hour(s))  SARS Coronavirus 2 [facility] order, Performed in [facility] lab) Nasopharyngeal Nasopharyngeal Swab     Status: None   Collection Time: 01/23/2019  2:59 PM   Specimen: Nasopharyngeal Swab  Result Value Ref Range Status   SARS Coronavirus 2 NEGATIVE NEGATIVE Final    Comment: (NOTE) If result is  NEGATIVE SARS-CoV-2 target nucleic acids are NOT DETECTED. The SARS-CoV-2 RNA is generally detectable in upper and lower  respiratory specimens during the acute phase of infection. The lowest  concentration of SARS-CoV-2 viral copies this assay can detect is 250  copies / mL. Sorren Vallier negative result does not preclude SARS-CoV-2 infection  and should not be used as the sole basis for treatment or other  patient management decisions.  Darian Ace negative result may occur with  improper specimen collection / handling, submission of specimen other  than nasopharyngeal swab, presence of viral mutation(s) within the  areas targeted by this assay, and inadequate number of viral copies  (<250 copies / mL). Nature Kueker negative result must be combined with clinical  observations, patient history, and epidemiological information. If result is POSITIVE SARS-CoV-2 target nucleic acids are DETECTED. The SARS-CoV-2 RNA is generally detectable in upper and lower  respiratory specimens dur ing the acute phase of infection.  Positive  results are indicative of active infection with SARS-CoV-2.  Clinical  correlation with patient history and other diagnostic information is  necessary to determine patient infection status.  Positive results do  not rule out bacterial infection or co-infection with other viruses. If result is PRESUMPTIVE POSTIVE SARS-CoV-2 nucleic acids MAY BE PRESENT.   Aceton Kinnear presumptive positive result was obtained on the submitted specimen  and confirmed on repeat testing.  While 2019 novel coronavirus  (SARS-CoV-2) nucleic acids may be present in the submitted sample  additional confirmatory testing may be necessary for epidemiological  and / or clinical management purposes  to differentiate between  SARS-CoV-2 and other Sarbecovirus currently known to infect humans.  If clinically indicated additional testing with an alternate test  methodology [phone]) is advised. The SARS-CoV-2 RNA is generally  detectable  in upper and lower respiratory sp ecimens during the acute  phase of infection. The expected result is Negative. Fact Sheet for Patients:  StrictlyIdeas.no Fact Sheet for Healthcare Providers: BankingDealers.co.za This test is not yet approved or cleared by the [place] FDA and has been authorized for detection and/or diagnosis of SARS-CoV-2 by FDA under an Emergency Use Authorization (EUA).  This EUA will remain in effect (meaning this test can be used) for the duration of the COVID-19 declaration under Section 564(b)(1) of the Act, 21 U.S.C. section 360bbb-3(b)(1), unless the authorization is terminated or revoked sooner. Performed at [facility], [place] [address] [place], [place] [address]          Radiology Studies: Dg Chest 2 View  Result Date: 01/24/2019 CLINICAL DATA:  Stage IV  lung cancer, on hospice. Fall. Worsening oxygen demands. EXAM: CHEST - 2 VIEW COMPARISON:  01/10/2019.  CT of 01/06/2019. FINDINGS: Motion degraded AP radiograph. The lateral view is of better quality. Patient rotated minimally right. Midline trachea. Normal heart size. Atherosclerosis in the transverse aorta. Tiny left pleural effusion, new. No pneumothorax. Worsened right-sided aeration, with near complete whiteout. Right-sided chest tube in place. Although there is no well-defined pleural air on the frontal view, fluid level on the lateral suggests hydropneumothorax. Worsened left-sided aeration, with interstitial thickening and areas of probable atelectasis and pulmonary nodularity. IMPRESSION: Motion degraded frontal radiograph. Significantly worsened right-sided aeration, likely due to pleural fluid and airspace disease. Given the appearance on the lateral view, suspect an underlying right-sided hydropneumothorax, with chest tube in place. Worsened left-sided aeration, likely due to atelectasis and underlying pulmonary nodules. Aortic  Atherosclerosis (ICD10-I70.0). Electronically Signed   By: Andrew [place] M.D.   On: 01/15/2019 11:08   Ct Angio Chest Pe W/cm &/or Wo Cm  Result Date: 01/26/2019 CLINICAL DATA:  63 year old [place] with shortness of breath. History of non-small cell lung cancer and malignant pleural effusion. Patient with RIGHT pleural catheter. EXAM: CT ANGIOGRAPHY CHEST WITH CONTRAST TECHNIQUE: Multidetector CT imaging of the chest was performed using the standard protocol during bolus administration of intravenous contrast. Multiplanar CT image reconstructions and MIPs were obtained to evaluate the vascular anatomy. CONTRAST:  194mL OMNIPAQUE IOHEXOL 350 MG/ML SOLN COMPARISON:  01/06/2019 CT FINDINGS: Cardiovascular: This is Andrew [place] technically satisfactory study. Segmental pulmonary emboli are identified within the LEFT UPPER lobe, lingula and LEFT LOWER lobe. Mild thoracic aortic atherosclerotic calcifications noted without thoracic aneurysm. Andrew [place] trace pericardial effusion is present. Mediastinum/Nodes: No definite enlarged lymph nodes. No mediastinal mass. Lungs/Pleura: Andrew [place] RIGHT pleural catheter is present with an increasing large RIGHT pleural effusion with small to moderate amount of anterior RIGHT pleural air. Near complete compressive atelectasis of the RIGHT lung is now noted. RIGHT pleural enhancement is again noted. Andrew [place] small - moderate LEFT pleural effusion is now noted. Multiple nodular opacities throughout the visualized LEFT lung are again identified and stable to slightly enlarged. There is no evidence of LEFT pneumothorax. Upper Abdomen: No acute abnormality. Musculoskeletal: Scattered sclerotic lesions within scattered ribs and thoracic spine again noted, some which have increased in size. Sebastian Lurz 1 cm sclerotic lesion within the T8 vertebral body previously measured 0.5 cm. Review of the MIP images confirms the above findings. IMPRESSION: 1. Multiple LEFT segmental pulmonary emboli. New small to moderate LEFT pleural effusion.  2. Increasing large RIGHT hydropneumothorax (much larger fluid component) with RIGHT pleural catheter in place. Near complete compressive atelectasis of the RIGHT lung now noted. RIGHT pleural enhancement again noted compatible with metastatic disease. 3. Stable to slightly enlarging diffuse nodules throughout the LEFT lung and enlarging sclerotic bony lesions, compatible with increasing metastatic disease. Critical Value/emergent results were called by telephone at the time of interpretation on 08/Andrew/2020 at 1:44 pm to [facility], Andrew [place]port , who verbally acknowledged these results. Electronically Signed   By: Margarette Canada M.D.   On: 01/03/2019 13:48   Vas [place] Lower Extremity Venous (dvt)  Result Date: 02/01/2019  Lower Venous Study Indications: Swelling, SOB, and pulmonary embolism.  Risk Factors: Cancer Metastatic adneocarcinoma. Comparison Study: No prior study on file for comparison Performing Technologist: Andrew [place]port RVS  Examination Guidelines: Andrew [place] complete evaluation includes B-mode imaging, spectral Doppler, color Doppler, and power Doppler as needed of all accessible portions of each vessel. Bilateral testing is considered an integral part  of Andrew [place] complete examination. Limited examinations for reoccurring indications may be performed as noted.  +---------+---------------+---------+-----------+----------+--------------+  RIGHT     Compressibility Phasicity Spontaneity Properties Thrombus Aging  +---------+---------------+---------+-----------+----------+--------------+  CFV       Partial         Yes       Yes                    Acute           +---------+---------------+---------+-----------+----------+--------------+  SFJ       Full                                                             +---------+---------------+---------+-----------+----------+--------------+  FV Prox   Full                                                              +---------+---------------+---------+-----------+----------+--------------+  FV Mid    Full                                                             +---------+---------------+---------+-----------+----------+--------------+  FV Distal Full                                                             +---------+---------------+---------+-----------+----------+--------------+  PFV       Full                                                             +---------+---------------+---------+-----------+----------+--------------+  POP       Full            Yes       Yes                                    +---------+---------------+---------+-----------+----------+--------------+  PTV       None                                             Acute           +---------+---------------+---------+-----------+----------+--------------+  PERO      None  Acute           +---------+---------------+---------+-----------+----------+--------------+  Gastroc   None                                             Acute           +---------+---------------+---------+-----------+----------+--------------+   +---------+---------------+---------+-----------+----------+--------------+  LEFT      Compressibility Phasicity Spontaneity Properties Thrombus Aging  +---------+---------------+---------+-----------+----------+--------------+  CFV       Full            Yes       Yes                                    +---------+---------------+---------+-----------+----------+--------------+  SFJ       Full                                                             +---------+---------------+---------+-----------+----------+--------------+  FV Prox   Full                                                             +---------+---------------+---------+-----------+----------+--------------+  FV Mid    Full                                                              +---------+---------------+---------+-----------+----------+--------------+  FV Distal Full                                                             +---------+---------------+---------+-----------+----------+--------------+  PFV       Full                                                             +---------+---------------+---------+-----------+----------+--------------+  POP       Partial         No        No                     Acute           +---------+---------------+---------+-----------+----------+--------------+  PTV       None  Acute           +---------+---------------+---------+-----------+----------+--------------+  PERO      None                                             Acute           +---------+---------------+---------+-----------+----------+--------------+     Summary: Right: Findings consistent with acute deep vein thrombosis involving the right common femoral vein, right posterior tibial veins, right peroneal veins, and right gastrocnemius veins. Left: Findings consistent with acute deep vein thrombosis involving the left popliteal vein, left posterior tibial veins, and left peroneal veins.  *See table(s) above for measurements and observations. Electronically signed by Deitra Mayo MD on 02/01/2019 at 9:30:09 AM.    Final         Scheduled Meds:  Chlorhexidine Gluconate Cloth  6 each Topical Daily   Chlorhexidine Gluconate Cloth  6 each Topical Daily   ipratropium-albuterol  3 mL Nebulization Q6H   mouth rinse  Andrew mL Mouth Rinse BID   morphine  Andrew mg Oral Q12H   pantoprazole  40 mg Oral BID AC   temazepam  Andrew mg Oral QHS   Continuous Infusions:  heparin 2,150 Units/hr (02/01/19 1721)     LOS: 1 day    Time spent: over 30 min 35 min critical care time with AHRF with submassive PE   Fayrene Helper, MD [facility] Pager AMION  If 7PM-7AM, please contact night-coverage [email or web address] Password  [facility] 02/01/2019, 6:13 PM

## 2019-02-01 NOTE — Progress Notes (Signed)
Patient urinated in the bed while asleep so he got out of bed to get into chair, where he sleeps at home, in moving he pulled new IV out. We talked about the Heparin gtt and his need for it. He stated he "just didn't feel that medicine was for him" "his Dad had problems with some medications too". And he also wanted a catheter because then he can take his pain medications without worry of trying to go to bathroom or use urinal at bedside. I talked with Dr Wallis Bamberg and updated him on patients concerns.

## 2019-02-02 ENCOUNTER — Ambulatory Visit: Payer: No Typology Code available for payment source | Admitting: Thoracic Surgery (Cardiothoracic Vascular Surgery)

## 2019-02-02 ENCOUNTER — Ambulatory Visit: Payer: Self-pay | Admitting: Thoracic Surgery (Cardiothoracic Vascular Surgery)

## 2019-02-02 DIAGNOSIS — I2699 Other pulmonary embolism without acute cor pulmonale: Secondary | ICD-10-CM | POA: Diagnosis not present

## 2019-02-02 LAB — CBC
HCT: 33.5 % — ABNORMAL LOW (ref 39.0–52.0)
Hemoglobin: 10.5 g/dL — ABNORMAL LOW (ref 13.0–17.0)
MCH: 26.9 pg (ref 26.0–34.0)
MCHC: 31.3 g/dL (ref 30.0–36.0)
MCV: 85.9 fL (ref 80.0–100.0)
Platelets: 398 10*3/uL (ref 150–400)
RBC: 3.9 MIL/uL — ABNORMAL LOW (ref 4.22–5.81)
RDW: 15.1 % (ref 11.5–15.5)
WBC: 16.2 10*3/uL — ABNORMAL HIGH (ref 4.0–10.5)
nRBC: 0.3 % — ABNORMAL HIGH (ref 0.0–0.2)

## 2019-02-02 LAB — COMPREHENSIVE METABOLIC PANEL
ALT: 23 U/L (ref 0–44)
AST: 28 U/L (ref 15–41)
Albumin: 3 g/dL — ABNORMAL LOW (ref 3.5–5.0)
Alkaline Phosphatase: 99 U/L (ref 38–126)
Anion gap: 12 (ref 5–15)
BUN: 11 mg/dL (ref 6–20)
CO2: 26 mmol/L (ref 22–32)
Calcium: 9 mg/dL (ref 8.9–10.3)
Chloride: 91 mmol/L — ABNORMAL LOW (ref 98–111)
Creatinine, Ser: 0.68 mg/dL (ref 0.61–1.24)
GFR calc Af Amer: >60 mL/min (ref >60)
GFR calc non Af Amer: >60 mL/min (ref >60)
Glucose, Bld: 153 mg/dL — ABNORMAL HIGH (ref 70–99)
Potassium: 4.9 mmol/L (ref 3.5–5.1)
Sodium: 129 mmol/L — ABNORMAL LOW (ref 135–145)
Total Bilirubin: 0.8 mg/dL (ref 0.3–1.2)
Total Protein: 7.2 g/dL (ref 6.5–8.1)

## 2019-02-02 LAB — HEPARIN LEVEL (UNFRACTIONATED): Heparin Unfractionated: 0.42 IU/mL (ref 0.30–0.70)

## 2019-02-02 LAB — GLUCOSE, CAPILLARY: Glucose-Capillary: 140 mg/dL — ABNORMAL HIGH (ref 70–99)

## 2019-02-02 LAB — MAGNESIUM: Magnesium: 2 mg/dL (ref 1.7–2.4)

## 2019-02-02 MED ORDER — SODIUM CHLORIDE 0.9 % IV SOLN
INTRAVENOUS | Status: AC
  Administered 2019-02-02: 17:00:00 via INTRAVENOUS

## 2019-02-02 NOTE — Progress Notes (Signed)
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   Social Worker interventions provided:   ADDENDUM: LCSW in communication via email with Pt [place] SW this afternoon and made aware that Pt sister, [name], is Pushing for authorization for [employer] to be approved by the VA/ is also inquiring about respite in home care rather than [facility] care. Marlon notes that Pt can receive [facility] care simultaneously with hospice care/can also receive respite care through [place] in addition to in home hospice care. LCSW encouraged him to follow up with Pt to verify his wishes and Carver Fila agreed to do so tomorrow/LCSW requested update after this conversation as well.   ADDENDUM: 01:07pm. LCSW spoke with Alinda Sierras from [place] who noted inability to have home health care [facility] and hospice care as far as she is aware/notes she can only help with transfer for discharge if Pt is going for rehab/otherwise LCSW can assist with helping with Pt care in the home or discharge to a facility for Long term care while under [facility] hospice care. LCSW noted understanding/will follow up with VA SW for clarification re: [facility].  ADDENDUM: 11:23am. LCSW made call to Pt [facility] SW, Marlon, and had conversation re: Pt hospital status/POC goals/discharge concerns. Marlon confirmed that [employer] would need to be approved by [place] when Pt is eligible/he can have in home care through [place] but he is not certain of the hours of help that can be provided/he also notes Pt can go to a facility that is contracted with the [facility] to provide care for him and still receive hospice care. He agreed to send LCSW list of local facilities that are options, with only one being in [facility] listed as [facility] in [place]. LCSW  provided him with [place] phone number to use as needed/agreed to follow up as needed for care coordination purposes.   ADDENDUM: 11:15am. LCSW received call from Alinda Sierras indicating LCSW can contact [place] SW. LCSW agreed to do so. She also notes following Medicare guide lines in terms of discharge to SNF/notes inquiry about family knowing that Pt will be revoking hospice if he goes to a SNF. LCSW noted belief that if it is a facility that [place] approves to have hospice care, Pt can still have hospice care in it. LCSW agreed to verify this with VA SW and follow up with Alinda Sierras accordingly.   ADDENDUM: 10:46am. LCSW left voice mail with Maxie Barb Transitions of Care Case Manager, with Update re: her continued pursuit of facility placement for Pt that is VA approved and request for call back re: who will contact [place] SW re: help in the home for Pt.  ADDENDUM: After discussing with Pt that he is open to LCSW and RN Liaison joining MD, Dr. Florene Glen, meeting this morning with him and his sister, [name], LCSW did conference call with RN Liaison and spent first portion of time talking with Pt and Juanna re: POC goals. Pt verbalized desire to go home but notes care need concerns. Pt open to exploration of VA Help in the home. LCSW agreed to follow up with Transitions of Care Case Manager at [facility] re: whom will contact Pt VA SW re: possibility of in home help paid for by the [place]. When MD entered the room, he verified that Pt  is open to treating the blood clot but does not want more interventions for his cancer/no discharge plan at this time/plan to monitor Pt. Plan is for hospital Transitions of Care Case Manager to continue to explore VA approved options for facility placement for Pt and also for LCSW to communicate with her re: contact with VA SW re: in home care for Pt. LCSW agreed to follow up with Pt and Juanna accordingly.  09:10am. LCSW called Pt sister, [name], and discussed discharge plans/her goals for Pt to  return home with more care in the home. She is aware that it is Pt decision to make and that Pt inquired about placement from the hospital/Juanna notes belief this is because he does not feel he has the care he needs at home and does not want to burden his mother and other sister. She became tearful when talking about Pt feeling like a burden on his family. She inquired about hospice level of care in the home/LCSW noted inability to provide 24/7 care but can increase visits as needs change/noted families and loved ones are responsible for providing most of patients care/can look into hiring someone (not home health as LCSW clarified this) to attend to care needs or see if VA can provide in home care. Conversation held re: Designer, fashion/clothing to go to [employer] unless [place] authorizes this when Pt is eligible/Follow up has occurred by [employer], Network engineer, with VA so VA knows Pt/family sentiments about going to [facility]/ Pt not eligible at this time as not within last 2 weeks of life. LCSW encouraged [name] not to call Around to [place] facilities due to this/noted if it is facility that [employer] with, Pt can continue to receive hospice services if approved by the VA/otherwise, Pt could be transferred to another hospice. LCSW noted hospital staff will coordinate [name] also discussed concerns with Pt being told by an MD in the ED LCSW believes that he could try other treatments that could save his life. [name] at [facility] for meeting with Pt MD at 10:00am. LCSW noted plan to call Pt to ask if LCSW and RN Liaison could join this meeting for assistance with POC goals discussion.   Follow up planned by social worker: LCSW to assist as needed with hospital discharge planning and POC goals clarification. LCSW to coordinate with VA SW and hospital team as needed. LCSW working on completion of FL2 to explore possibility of [place] facility as discharge option as this is a Health and safety inspector  facility.   Thank you and please feel free to contact us with further questions or concerns.  Christena Deem, LCSW Licensed Clinical Therapist, art [phone]

## 2019-02-02 NOTE — Progress Notes (Signed)
Manufacturing engineer [facility]) Hospitalized Hospice GIP  Mr. Cregger is under hospice services for lung cancer per Dr. Tomasa Hosteller. He was experiencing SOB that did not subside. Hospice offered to send a RN to visit, but due to the noted distress and pt remaining full code, decision was made to activate EMS. Mr. Zawistowski is admitted for acute respiratory failure, segmental PE. This is a related hospital admission.  Family meeting this am with patient, sister, Dr. Florene Glen, [facility] hospital Liaison and [facility] CSW to address [place] and treatment plan. Palliative Medical team will also see the patient. Patient has concerns about returning home and having enough care. He is not residential hospice appropriate as of yet. [facility] CSW will contact the [facility] to research what options maybe available to him after discharge.   VS:  98.3, 130/79, 80, 20, 99% (when agrees to wear prob) on HFNC 8L currently I:O 1226.9/700  Labs: Acid base excess ven 3.2,  Bicarb ven 29.2,  Na 129, Chloride 91, Glucose 153,  Albumin 3.0, WBC 16.2, RBC 3.90, Hgb 10.5, HCT 33.5, nRBC 0.3  Medications: Heparin 2150 units/hour, morphine 4 tab 15mg  PRN @ 0752,  Morphine scheduled 15mg  PO BID, DuoNeb 0.5-2.5mg  nebulizer Q6hrs.  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  Will continue to monitor with anticoagulation. Pulm c/s - now signed off (discussed possibility of catheter directed thrombolytics, but they noted he would not  be candidate)  Metastatic adenocarcinoma of the lung  - further management as per Dr Maylon Peppers - he's no longer interested in chemotherapy - Dr. Maylon Peppers recommending doac at time of discharge - appreciate Dr. Lorette Ang assistance  Diabetes Mellitus: Resume SSI.  Get A1c.   Lactic acidosis/  improved  Hyponatremia: mild, continue to monitor  Leukocytosis and thrombocytosis:  Probably reactive.  Pt denies any fevers or chills.   Anemia of chronic disease/ malignancy.  Monitor.  D/C planning: Return home with hospice with additional assistance if available via [place] services.   IDT: Updated Family: as note above with meeting.  GOC: Code status now DNR/DNI  Thank you, Farrel Gordon, RN, CCM [place] (check AMION for daily list of liaisons) [phone]

## 2019-02-02 NOTE — Progress Notes (Signed)
Patient line occluded, IV team consult ordered for evaluation. Heparin drip stopped 0425.

## 2019-02-02 NOTE — Progress Notes (Signed)
PROGRESS NOTE    Andrew Davenport  PIR:[id number] DOB: [birth year] DOA: 01/18/2019 PCP: Patient, No Pcp Per   Brief Narrative:  Andrew Davenport is Andrew Davenport 63 y.o. 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  Pt does not desire any further cancer treatment, but does want to treat the PE at this point.  When he is discharged, he desires to resume hospice.   Palliative care has been consulted.   Assessment & Plan:   Active Problems:   Malignant pleural effusion   Hypoxemia   Hyponatremia   Type 2 diabetes mellitus with chronic kidney disease, without long-term current use of insulin (HCC)   Adenocarcinoma of lung (HCC)   Leukocytosis   Normocytic anemia   Thrombocytosis (HCC)   Acute respiratory failure with hypoxia (HCC)   Acute pulmonary embolus (HCC)   PE (pulmonary thromboembolism) ([place])  Goals of Care: pt DNR.  He's not interested in additional cancer treatments, but he is interested in treating his PE at this time.  Had discussion with hospice and social worker today with sister.  She was concerned about issues with his VA coverage and eventually getting to inpatient hospice when appropriate.  Discussed at this point in time, with desire to treat PE, plan to see how he does on anticoagulation and see if able to wean O2.  Hopefully will be able to wean O2 to point where he maybe able to go home with DOAC with hospice (will need 30 day card for this as this isn't covered by hospice - discussed with Andrew Davenport - towards end of 30 days, will need to reevaluate plan of care with regards to anticoagulation).   Given complexity of situation, I've asked palliative care for their assistance as well  Acute Hypoxic Respiratory Failure and Acute Hypercarbic Respiratory Failure   Metastatic Lung Cancer   Submassive  Pulmonary Embolism Likely related to PE was well as small to moderate L effusion and increasing R hydropneumothorax Continue drainage of pleurx daily Consider thoracentesis of L side, will hold off for now based on discussion with pt Continue heparin GTT Echo with EF 55-60%, impaired relaxation - he has evidence of increased RV pressure with D shaped IV septum (see report) and mildly reduced systolic function LE [place] with bilateral LE DVT's Requiring 10 L high flow, BP is normal and HR improved.  Will continue to monitor with anticoagulation. Wean O2 as tolerated Pulm c/s - now signed off (discussed possibility of catheter directed thrombolytics, but they noted he would not be candidate)  Metastatic adenocarcinoma of the lung  - further management as per Dr Maylon Peppers - he's no longer interested in chemotherapy - Dr. Maylon Peppers recommending doac at time of discharge - appreciate Dr. Lorette Ang assistance  Diabetes Mellitus:  SSI.  Get A1c (6.6)  Lactic acidosis/  improved  Hyponatremia: worsening today, follow with gentle IVF  Leukocytosis and thrombocytosis:  Probably reactive.  Continue to monitor  Anemia of chronic disease/ malignancy.  Monitor.   DVT prophylaxis: heparin gtt Code Status: DNR Family Communication: sister at bedside Disposition Plan: pending   Consultants:   Oncology  Pulmonology  Procedures:  Echo IMPRESSIONS    1. The left ventricle has normal systolic function, with an ejection fraction of 55-60%. The cavity size was normal. Left ventricular diastolic  Doppler parameters are consistent with impaired relaxation. No evidence of left ventricular regional wall  motion abnormalities.  2. The right ventricle has mildly reduced systolic function. The cavity was moderately enlarged. There is no increase in right ventricular wall thickness. D-shaped interventricular septum suggests RV pressure/volume overload.  3. Trivial pericardial effusion is present.  4. The  aortic valve is tricuspid. Mild calcification of the aortic valve. No stenosis of the aortic valve.  5. The aortic root is normal in size and structure.  6. The inferior vena cava was normal in size with <50% respiratory variability. PA systolic pressure 43 mmHg.  7. No evidence of mitral valve stenosis. No significant mitral regurgitation.  Antimicrobials:  Anti-infectives (From admission, onward)   None     Subjective: Continued SOB, feels better than at presentation Sister at bedisde, long discussion about plan of care and options going forward  Objective: Vitals:   02/02/19 0532 02/02/19 0754 02/02/19 0938 02/02/19 1433  BP:  130/79    Pulse:      Resp:      Temp:  98.4 F (36.9 C)    TempSrc:  Oral    SpO2: 96% 100% 99% 97%  Weight:      Height:        Intake/Output Summary (Last 24 hours) at 02/02/2019 1522 Last data filed at 02/02/2019 0900 Gross per 24 hour  Intake 1221.11 ml  Output 500 ml  Net 721.11 ml   Filed Weights   01/29/2019 1840  Weight: 107 kg    Examination:  General: No acute distress. Cardiovascular: Heart sounds show Andrew Davenport regular rate, and rhythm Lungs: on 8 L HF, increased WOB Abdomen: Soft, nontender, nondistended  Neurological: Alert and oriented 3. Moves all extremities 4. Cranial nerves II through XII grossly intact. Skin: Warm and dry. No rashes or lesions. Extremities: No clubbing or cyanosis. No edema.   Data Reviewed: I have personally reviewed following labs and imaging studies  CBC: Recent Labs  Lab 01/27/19 1321 01/08/2019 1032 02/01/19 0220 02/02/19 0529  WBC 11.0* 20.8* 19.6* 16.2*  NEUTROABS 7.3  --   --   --   HGB 11.0* 12.2* 10.8* 10.5*  HCT 34.7* 38.6* 34.7* 33.5*  MCV 84.8 86.7 86.5 85.9  PLT 419* 444* 387 950   Basic Metabolic Panel: Recent Labs  Lab 01/27/19 1321 01/06/2019 1032 02/01/19 0220 02/02/19 0529  NA 134* 131* 133* 129*  K 4.1 4.4 4.8 4.9  CL 95* 89* 90* 91*  CO2 26 25 27 26   GLUCOSE 116* 143* 144*  153*  BUN 10 10 12 11   CREATININE 0.86 0.78 0.65 0.68  CALCIUM 9.4 9.2 9.3 9.0  MG  --   --   --  2.0   GFR: Estimated Creatinine Clearance: 126 mL/min (by C-G formula based on SCr of 0.68 mg/dL). Liver Function Tests: Recent Labs  Lab 01/27/19 1321 01/06/2019 1032 02/01/19 0220 02/02/19 0529  AST 18 19 19 28   ALT 21 22 20 23   ALKPHOS 109 101 95 99  BILITOT 0.4 0.8 0.6 0.8  PROT 7.0 7.5 7.1 7.2  ALBUMIN 3.3* 3.3* 2.9* 3.0*   No results for input(s): LIPASE, AMYLASE in the last 168 hours. No results for input(s): AMMONIA in the last 168 hours. Coagulation Profile: Recent Labs  Lab 01/02/2019 1459  INR 1.3*   Cardiac Enzymes: No results for input(s): CKTOTAL, CKMB, CKMBINDEX, TROPONINI in the last 168 hours. BNP (last 3 results) No results for input(s): PROBNP in the last  8760 hours. HbA1C: Recent Labs    02/01/19 0220  HGBA1C 6.6*   CBG: Recent Labs  Lab 02/02/19 0825  GLUCAP 140*   Lipid Profile: No results for input(s): CHOL, HDL, LDLCALC, TRIG, CHOLHDL, LDLDIRECT in the last 72 hours. Thyroid Function Tests: No results for input(s): TSH, T4TOTAL, FREET4, T3FREE, THYROIDAB in the last 72 hours. Anemia Panel: No results for input(s): VITAMINB12, FOLATE, FERRITIN, TIBC, IRON, RETICCTPCT in the last 72 hours. Sepsis Labs: Recent Labs  Lab 01/02/2019 1525 01/20/2019 2323 02/01/19 1200  LATICACIDVEN 2.6* 2.5* 1.4    Recent Results (from the past 240 hour(s))  SARS Coronavirus 2 [facility] order, Performed in [facility] hospital lab) Nasopharyngeal Nasopharyngeal Swab     Status: None   Collection Time: 01/14/2019  2:59 PM   Specimen: Nasopharyngeal Swab  Result Value Ref Range Status   SARS Coronavirus 2 NEGATIVE NEGATIVE Final    Comment: (NOTE) If result is NEGATIVE SARS-CoV-2 target nucleic acids are NOT DETECTED. The SARS-CoV-2 RNA is generally detectable in upper and lower  respiratory specimens during the acute phase of infection. The lowest    concentration of SARS-CoV-2 viral copies this assay can detect is 250  copies / mL. Tracye Szuch negative result does not preclude SARS-CoV-2 infection  and should not be used as the sole basis for treatment or other  patient management decisions.  Emmarie Sannes negative result may occur with  improper specimen collection / handling, submission of specimen other  than nasopharyngeal swab, presence of viral mutation(s) within the  areas targeted by this assay, and inadequate number of viral copies  (<250 copies / mL). Demir Titsworth negative result must be combined with clinical  observations, patient history, and epidemiological information. If result is POSITIVE SARS-CoV-2 target nucleic acids are DETECTED. The SARS-CoV-2 RNA is generally detectable in upper and lower  respiratory specimens dur ing the acute phase of infection.  Positive  results are indicative of active infection with SARS-CoV-2.  Clinical  correlation with patient history and other diagnostic information is  necessary to determine patient infection status.  Positive results do  not rule out bacterial infection or co-infection with other viruses. If result is PRESUMPTIVE POSTIVE SARS-CoV-2 nucleic acids MAY BE PRESENT.   Lavere Shinsky presumptive positive result was obtained on the submitted specimen  and confirmed on repeat testing.  While 2019 novel coronavirus  (SARS-CoV-2) nucleic acids may be present in the submitted sample  additional confirmatory testing may be necessary for epidemiological  and / or clinical management purposes  to differentiate between  SARS-CoV-2 and other Sarbecovirus currently known to infect humans.  If clinically indicated additional testing with an alternate test  methodology [phone]) is advised. The SARS-CoV-2 RNA is generally  detectable in upper and lower respiratory sp ecimens during the acute  phase of infection. The expected result is Negative. Fact Sheet for Patients:  StrictlyIdeas.no Fact Sheet  for Healthcare Providers: BankingDealers.co.za This test is not yet approved or cleared by the [place] FDA and has been authorized for detection and/or diagnosis of SARS-CoV-2 by FDA under an Emergency Use Authorization (EUA).  This EUA will remain in effect (meaning this test can be used) for the duration of the COVID-19 declaration under Section 564(b)(1) of the Act, 21 U.S.C. section 360bbb-3(b)(1), unless the authorization is terminated or revoked sooner. Performed at [facility], [place] [address] [place], [place] [address]          Radiology Studies: Vas [place] Lower Extremity Venous (dvt)  Result Date: 02/01/2019  Lower Venous Study Indications: Swelling, SOB, and pulmonary embolism.  Risk Factors: Cancer Metastatic adneocarcinoma. Comparison Study: No prior study on file for comparison Performing Technologist: Sharion Dove RVS  Examination Guidelines: Anil Havard complete evaluation includes B-mode imaging, spectral Doppler, color Doppler, and power Doppler as needed of all accessible portions of each vessel. Bilateral testing is considered an integral part of Christoher Drudge complete examination. Limited examinations for reoccurring indications may be performed as noted.  +---------+---------------+---------+-----------+----------+--------------+  RIGHT     Compressibility Phasicity Spontaneity Properties Thrombus Aging  +---------+---------------+---------+-----------+----------+--------------+  CFV       Partial         Yes       Yes                    Acute           +---------+---------------+---------+-----------+----------+--------------+  SFJ       Full                                                             +---------+---------------+---------+-----------+----------+--------------+  FV Prox   Full                                                             +---------+---------------+---------+-----------+----------+--------------+  FV Mid    Full                                                              +---------+---------------+---------+-----------+----------+--------------+  FV Distal Full                                                             +---------+---------------+---------+-----------+----------+--------------+  PFV       Full                                                             +---------+---------------+---------+-----------+----------+--------------+  POP       Full            Yes       Yes                                    +---------+---------------+---------+-----------+----------+--------------+  PTV       None  Acute           +---------+---------------+---------+-----------+----------+--------------+  PERO      None                                             Acute           +---------+---------------+---------+-----------+----------+--------------+  Gastroc   None                                             Acute           +---------+---------------+---------+-----------+----------+--------------+   +---------+---------------+---------+-----------+----------+--------------+  LEFT      Compressibility Phasicity Spontaneity Properties Thrombus Aging  +---------+---------------+---------+-----------+----------+--------------+  CFV       Full            Yes       Yes                                    +---------+---------------+---------+-----------+----------+--------------+  SFJ       Full                                                             +---------+---------------+---------+-----------+----------+--------------+  FV Prox   Full                                                             +---------+---------------+---------+-----------+----------+--------------+  FV Mid    Full                                                             +---------+---------------+---------+-----------+----------+--------------+  FV Distal Full                                                              +---------+---------------+---------+-----------+----------+--------------+  PFV       Full                                                             +---------+---------------+---------+-----------+----------+--------------+  POP       Partial         No        No  Acute           +---------+---------------+---------+-----------+----------+--------------+  PTV       None                                             Acute           +---------+---------------+---------+-----------+----------+--------------+  PERO      None                                             Acute           +---------+---------------+---------+-----------+----------+--------------+     Summary: Right: Findings consistent with acute deep vein thrombosis involving the right common femoral vein, right posterior tibial veins, right peroneal veins, and right gastrocnemius veins. Left: Findings consistent with acute deep vein thrombosis involving the left popliteal vein, left posterior tibial veins, and left peroneal veins.  *See table(s) above for measurements and observations. Electronically signed by Deitra Mayo MD on 02/01/2019 at 9:30:09 AM.    Final         Scheduled Meds:  Chlorhexidine Gluconate Cloth  6 each Topical Daily   Chlorhexidine Gluconate Cloth  6 each Topical Daily   ipratropium-albuterol  3 mL Nebulization Q6H   mouth rinse  15 mL Mouth Rinse BID   morphine  15 mg Oral Q12H   pantoprazole  40 mg Oral BID AC   temazepam  15 mg Oral QHS   Continuous Infusions:  heparin 2,150 Units/hr (02/02/19 0900)     LOS: 2 days    Time spent: over 30 min   Fayrene Helper, MD [facility] Pager AMION  If 7PM-7AM, please contact night-coverage [email or web address] Password [email or web address] 02/02/2019, 3:22 PM

## 2019-02-02 NOTE — Progress Notes (Signed)
ANTICOAGULATION CONSULT NOTE -  Consult  Pharmacy Consult for Heparin Indication: pulmonary embolus & DVT  No Known Allergies  Patient Measurements: Height: 6\' 1"  (185.4 cm) Weight: 235 lb 14.3 oz (107 kg) IBW/kg (Calculated) : 79.9 Heparin Dosing Weight: 103 kg  Vital Signs: Temp: 98.3 F (36.8 C) (09/01 0400) Temp Source: Oral (09/01 0400) BP: 134/88 (09/01 0333) Pulse Rate: 88 (08/31 2052)  Labs: Recent Labs    01/24/2019 1032 01/10/2019 1459  02/01/19 0220 02/01/19 1331 02/01/19 2147 02/02/19 0529  HGB 12.2*  --   --  10.8*  --   --  10.5*  HCT 38.6*  --   --  34.7*  --   --  33.5*  PLT 444*  --   --  387  --   --  398  APTT  --  32  --   --   --   --   --   LABPROT  --  16.5*  --   --   --   --   --   INR  --  1.3*  --   --   --   --   --   HEPARINUNFRC  --   --    < >  --  0.28* 0.13* 0.42  CREATININE 0.78  --   --  0.65  --   --  0.68   < > = values in this interval not displayed.   Estimated Creatinine Clearance: 126 mL/min (by C-G formula based on SCr of 0.68 mg/dL).  Medical History: Past Medical History:  Diagnosis Date  . Diabetes mellitus without complication ([place])   . GERD (gastroesophageal reflux disease)    Medications:  No anticoagulation PTA  Assessment: 63 yr male presents with shortness of breath.  PMH signficant for stage 4 lung cancer. CTAngio shows multiple left segmental pulmonary emboli. Pharmacy consulted to dose IV heparin, begin with 4000 unit bolus, 1700 units/hr.    8/31: AM hep level low, rate increased, patient pulled IV out x2, last at 0230 Heparin resumed at 0521 at 1900 units/hr, 2p level ~ low, rate increased to 2150 units/hr 2147 HL = 0.13 units/hr- patient has been pulling IV out and RN stopped at 2130 for occluded line. HL was drawn soon after. No bleeding noted by RN. Re-bolused with 2000 units/hr  Today, 02/02/2019 HL 0.42 this am, on 2150 units/hr, in goal range H/H low/stable. Plt wnl Less agitated today  Goal of  Therapy:  Heparin level 0.3-0.7 units/ml Monitor platelets by anticoagulation protocol: Yes   Plan:   Continue heparin drip at 2150 units/hr  Follow heparin level and CBC daily while on heparin  Ongoing discussion for discharge, possible hospice facility   Minda Ditto PharmD Pager [phone] 02/02/2019, 6:16 AM

## 2019-02-02 NOTE — Progress Notes (Signed)
VBG results from 0720 on 02/02/2019 are as follows:ph-7.35/pc02:54.6/po2:below reportable range/sat% 44.7.Marland KitchenMarland Kitchen

## 2019-02-02 NOTE — Progress Notes (Signed)
Spoke with patient's sister, Beryle Beams, last pm on the phone, discussed concerns. She only wants patient to be discharged home or [facility]. She wants to be notified prior to patient discharge.

## 2019-02-02 DEATH — deceased

## 2019-02-03 DIAGNOSIS — E871 Hypo-osmolality and hyponatremia: Secondary | ICD-10-CM

## 2019-02-03 DIAGNOSIS — Z7189 Other specified counseling: Secondary | ICD-10-CM | POA: Diagnosis not present

## 2019-02-03 DIAGNOSIS — Z515 Encounter for palliative care: Secondary | ICD-10-CM | POA: Diagnosis not present

## 2019-02-03 DIAGNOSIS — C3491 Malignant neoplasm of unspecified part of right bronchus or lung: Secondary | ICD-10-CM | POA: Diagnosis not present

## 2019-02-03 DIAGNOSIS — I2609 Other pulmonary embolism with acute cor pulmonale: Secondary | ICD-10-CM | POA: Diagnosis not present

## 2019-02-03 DIAGNOSIS — D72829 Elevated white blood cell count, unspecified: Secondary | ICD-10-CM

## 2019-02-03 DIAGNOSIS — J9601 Acute respiratory failure with hypoxia: Secondary | ICD-10-CM | POA: Diagnosis not present

## 2019-02-03 LAB — CBC
HCT: 33.8 % — ABNORMAL LOW (ref 39.0–52.0)
Hemoglobin: 10.3 g/dL — ABNORMAL LOW (ref 13.0–17.0)
MCH: 26.8 pg (ref 26.0–34.0)
MCHC: 30.5 g/dL (ref 30.0–36.0)
MCV: 88 fL (ref 80.0–100.0)
Platelets: 426 10*3/uL — ABNORMAL HIGH (ref 150–400)
RBC: 3.84 MIL/uL — ABNORMAL LOW (ref 4.22–5.81)
RDW: 15.2 % (ref 11.5–15.5)
WBC: 16.5 10*3/uL — ABNORMAL HIGH (ref 4.0–10.5)
nRBC: 0.2 % (ref 0.0–0.2)

## 2019-02-03 LAB — HEPARIN LEVEL (UNFRACTIONATED)
Heparin Unfractionated: 0.25 IU/mL — ABNORMAL LOW (ref 0.30–0.70)
Heparin Unfractionated: 0.58 IU/mL (ref 0.30–0.70)

## 2019-02-03 LAB — COMPREHENSIVE METABOLIC PANEL
ALT: 28 U/L (ref 0–44)
AST: 27 U/L (ref 15–41)
Albumin: 2.9 g/dL — ABNORMAL LOW (ref 3.5–5.0)
Alkaline Phosphatase: 125 U/L (ref 38–126)
Anion gap: 11 (ref 5–15)
BUN: 10 mg/dL (ref 6–20)
CO2: 27 mmol/L (ref 22–32)
Calcium: 9.1 mg/dL (ref 8.9–10.3)
Chloride: 93 mmol/L — ABNORMAL LOW (ref 98–111)
Creatinine, Ser: 0.69 mg/dL (ref 0.61–1.24)
GFR calc Af Amer: >60 mL/min (ref >60)
GFR calc non Af Amer: >60 mL/min (ref >60)
Glucose, Bld: 156 mg/dL — ABNORMAL HIGH (ref 70–99)
Potassium: 4.7 mmol/L (ref 3.5–5.1)
Sodium: 131 mmol/L — ABNORMAL LOW (ref 135–145)
Total Bilirubin: 1 mg/dL (ref 0.3–1.2)
Total Protein: 7 g/dL (ref 6.5–8.1)

## 2019-02-03 LAB — MAGNESIUM: Magnesium: 2 mg/dL (ref 1.7–2.4)

## 2019-02-03 MED ORDER — HEPARIN (PORCINE) 25000 UT/250ML-% IV SOLN
2300.0000 [IU]/h | INTRAVENOUS | Status: DC
  Administered 2019-02-03 – 2019-02-04 (×3): 2300 [IU]/h via INTRAVENOUS
  Filled 2019-02-03 (×2): qty 250

## 2019-02-03 NOTE — Progress Notes (Signed)
[facility] 1238 - Manufacturing engineer [facility]) Hospitalized Hospice GIP RN Note:  This is a related hospital admission with a hospice diagnosis of lung cancer per Dr. Tomasa Hosteller of [place]. Patient was experiencing SOB and initiated EMS due to distress instead of waiting to see an [facility] RN in the home. Patient is a Full Code and was admitted to the hospital  for acute respiratory failure and segmental PE.    Unable to visit patient in the hospital due to [place] hospital visitation restrictions, so spoke to bedside RN for report who stated that patient is feeling better but has needed prn medication for discomfort during activity/ambulation,  is tolerating a decrease from 10 to 8 in his HFNC O2 this morning and remains on heparin drip for bilateral LE DVT and PE.  Was also able to speak with Dr. Rowe Pavy on the phone who stated that PMT is following for [place] and will continue to assess for next day or two about POC. Also, reached out to patient in room who declined to talk at this time stating "feeling out of breath - can't talk on phone now".    VS: 97.9, 129/79, 110, 32 on HFNC at 8L Labs: (02/03/19) Na 131, Chl 93, glucose 156, Albumin 2.9, WBC 16.5, RBC 3.84, Hemo 10.3, HCT 33.8, Plate 426 Echo Imaging: Trivial pericardial effusion present, Ejection fraction 55-60%. Medications:Duoneb 0.5- 2.5mg /44ml nebulizer q6hr, MS Contin 15mg  tab q12hr, Restoril cap 15mg  at HS IV/PRN:NS IVF at 32ml/hr d/c'd today, Heparin 100units/ml in 212ml 0.45% NS at 23 ml/hr continuous, MSIR 15mg  tab adm at 0418 and 0800 on 02/03/19.  MD Notes on (02/02/19) Acute Respiratory FX/Metastatic Lung CA/Submassive Pulmonary Embolism -Continue drainage of pleurx daily, continue to monitor with anticoagulation, wean O2 as tolerated, Oncology and PMT consulted, gentle IVF for hyponatremia.  D/C Planning : Disposition Plan pending - continue to assess at this time.  GOC; Patient DNR - Ongoing assessment to see if patient can return home with hospice or  needs more assistance. Regarding [place] meeting with Hospice/Family and SW on 02/02/19 - Patient is not interested in treating CA but would like to continue PE tx  in the hospital and desires to wean off O2 enough to go home with hospice. PMT has been consulted. IDT: Updated  Family: Spoke with patient sister [place]- listened and supported.   Please call [facility] for transportation needs of ACC patients. Gar Ponto, RN  [facility] Liaison (listed on [place]) [phone]

## 2019-02-03 NOTE — Progress Notes (Signed)
ANTICOAGULATION CONSULT NOTE -  Consult  Pharmacy Consult for Heparin Indication: pulmonary embolus & DVT  No Known Allergies  Patient Measurements: Height: 6\' 1"  (185.4 cm) Weight: 235 lb 14.3 oz (107 kg) IBW/kg (Calculated) : 79.9 Heparin Dosing Weight: 88 kg  Vital Signs: Temp: 98.2 F (36.8 C) (09/02 0000) Temp Source: Oral (09/02 0000) BP: 136/83 (09/01 2049) Pulse Rate: 118 (09/01 2049)  Labs: Recent Labs    01/19/2019 1459  02/01/19 0220  02/01/19 2147 02/02/19 0529 02/03/19 0147  HGB  --   --  10.8*  --   --  10.5* 10.3*  HCT  --   --  34.7*  --   --  33.5* 33.8*  PLT  --   --  387  --   --  398 426*  APTT 32  --   --   --   --   --   --   LABPROT 16.5*  --   --   --   --   --   --   INR 1.3*  --   --   --   --   --   --   HEPARINUNFRC  --    < >  --    < > 0.13* 0.42 0.25*  CREATININE  --   --  0.65  --   --  0.68 0.69   < > = values in this interval not displayed.   Estimated Creatinine Clearance: 126 mL/min (by C-G formula based on SCr of 0.69 mg/dL).  Medical History: Past Medical History:  Diagnosis Date  . Diabetes mellitus without complication ([place])   . GERD (gastroesophageal reflux disease)    Medications:  No anticoagulation PTA  Assessment: 63 yr male presents with shortness of breath.  PMH signficant for stage 4 lung cancer. CTAngio shows multiple left segmental pulmonary emboli. Pharmacy consulted to dose IV heparin, begin with 4000 unit bolus, 1700 units/hr.    8/31: AM hep level low, rate increased, patient pulled IV out x2, last at 0230 Heparin resumed at 0521 at 1900 units/hr, 2p level ~ low, rate increased to 2150 units/hr 2147 HL = 0.13 units/hr- patient has been pulling IV out and RN stopped at 2130 for occluded line. HL was drawn soon after. No bleeding noted by RN. Re-bolused with 2000 units/hr 9/1 HL 0.42 this am, on 2150 units/hr, in goal range H/H low/stable. Plt wnl Less agitated today Today, 9/2 0147 HL = 0.25 below goal, no  IV interuptions or bleeding per RN.   Goal of Therapy:  Heparin level 0.3-0.7 units/ml Monitor platelets by anticoagulation protocol: Yes   Plan:   Increase heparin drip to 2300 units/hr  Recheck HL in 6 hours  Follow heparin level and CBC daily while on heparin  Ongoing discussion for discharge, possible hospice facility  Andrew Davenport 02/03/2019, 4:14 AM

## 2019-02-03 NOTE — Consult Note (Signed)
Consultation Note Date: 02/03/2019   Patient Name: Andrew Davenport  DOB: 15-Nov-1955  MRN: [id number]  Age / Sex: 63 y.o., male  PCP: Patient, No Pcp Per Referring Physician: Jonnie Finner, DO  Reason for Consultation: Establishing goals of care  HPI/Patient Profile: 63 y.o. male     admitted on 01/02/2019    Clinical Assessment and Goals of Care: 63 year old gentleman with a life limiting illness of metastatic lung cancer, enrolled in hospice services, also with underlying history of diabetes, chronic kidney disease.  Patient was admitted to the hospital for shortness of breath, found to have submassive pulmonary embolism as well as evidence of DVT.  He is currently admitted in stepdown unit at [facility] in [place], [place].  He is on IV anticoagulation with heparin drip.  A palliative medicine consultation has been requested for additional goals of care discussions, extra layer of support.  Patient is awake alert, resting comfortably in bed.  He is able to talk without getting short of breath however becomes fatigued with prolonged conversations.  We discussed about scope of current hospitalization.  He states that he is hopeful that IV heparin will help and that he will be able to get active treatment for PE, DVT.  He talks about diminished appetite and simply lack of interest in eating.  Patient is trying to keep up with his liquid intake.  He has been trying to sip on broth.  He denies any acute chest pain, denies any acute shortness of breath.  We discussed about his current pain management regimen.  We will continue to monitor.  See additional discussion/recommendations as listed below.  Also discussed briefly with hospice liaison over the phone.  Palliative medicine is specialized medical care for people living with serious illness. It focuses on providing relief from the symptoms and  stress of a serious illness. The goal is to improve quality of life for both the patient and the family.  Goals of care: Broad aims of medical therapy in relation to the patient's values and preferences. Our aim is to provide medical care aimed at enabling patients to achieve the goals that matter most to them, given the circumstances of their particular medical situation and their constraints.    NEXT OF KIN Daughter who lives in [place], [place].  Lives with mother and sister.  SUMMARY OF RECOMMENDATIONS   Agree with DO NOT RESUSCITATE Continue current mode of care, patient desires continuation of anticoagulation and active treatment of PE, DVT. Pain and non-pain symptom management needs noted.  Patient is on scheduled MS Contin, also has PRN MSIR available.  Continue the same.  Continue to monitor needs for supplemental oxygen.  Currently on 8 L high flow nasal cannula. Patient has a daughter who lives in [place], [place].  He lives in [place], [place] with his mother and sister Kenney Houseman.  He states that there is some healthcare power of attorney paperwork completed however he does not recall whom he designated as the first POA his  daughter or his sister.  He wishes for his family to make decisions on his behalf in a collective manner.  For now, the patient himself is alert awake oriented and decisional. Patient is already enrolled in hospice services.  They are following along in this hospitalization.  Further goals of care discussions depending on patient's hospital course.  Patient may likely be able to go back home with hospice services or may require transfer to residential hospice towards the end of this hospitalization.  Continue to monitor. Thank you for the consult.  Code Status/Advance Care Planning:  DNR    Symptom Management:    as above   Palliative Prophylaxis:   Delirium Protocol   Psycho-social/Spiritual:   Desire for further Chaplaincy  support:yes  Additional Recommendations: Education on Hospice  Prognosis:   < 6 months  Discharge Planning: Home with Hospice      Primary Diagnoses: Present on Admission: . Acute respiratory failure with hypoxia ([place]) . Type 2 diabetes mellitus with chronic kidney disease, without long-term current use of insulin ([place]) . Normocytic anemia . Malignant pleural effusion . Hyponatremia . Adenocarcinoma of lung ([place]) . Leukocytosis . Thrombocytosis ([facility]) . Hypoxemia . Acute pulmonary embolus (HCC)   I have reviewed the medical record, interviewed the patient and family, and examined the patient. The following aspects are pertinent.  Past Medical History:  Diagnosis Date  . Diabetes mellitus without complication ([place])   . GERD (gastroesophageal reflux disease)    Social History   Socioeconomic History  . Marital status: Single    Spouse name: Not on file  . Number of children: Not on file  . Years of education: Not on file  . Highest education level: Not on file  Occupational History  . Not on file  Social Needs  . Financial resource strain: Patient refused  . Food insecurity    Worry: Patient refused    Inability: Patient refused  . Transportation needs    Medical: Patient refused    Non-medical: Patient refused  Tobacco Use  . Smoking status: Current Every Day Smoker    Packs/day: 0.25    Years: 43.00    Pack years: 10.75  . Smokeless tobacco: Never Used  Substance and Sexual Activity  . Alcohol use: No    Alcohol/week: 0.0 standard drinks  . Drug use: No  . Sexual activity: Not Currently  Lifestyle  . Physical activity    Days per week: Patient refused    Minutes per session: Patient refused  . Stress: Patient refused  Relationships  . Social Herbalist on phone: Patient refused    Gets together: Patient refused    Attends religious service: Patient refused    Active member of club or organization: Patient refused    Attends meetings of  clubs or organizations: Patient refused    Relationship status: Patient refused  Other Topics Concern  . Not on file  Social History Narrative  . Not on file   History reviewed. No pertinent family history. Scheduled Meds: . Chlorhexidine Gluconate Cloth  6 each Topical Daily  . Chlorhexidine Gluconate Cloth  6 each Topical Daily  . ipratropium-albuterol  3 mL Nebulization Q6H  . mouth rinse  15 mL Mouth Rinse BID  . morphine  15 mg Oral Q12H  . pantoprazole  40 mg Oral BID AC  . temazepam  15 mg Oral QHS   Continuous Infusions: . sodium chloride 75 mL/hr at 02/03/19 0420  . heparin 2,300 Units/hr (  02/03/19 1526)   PRN Meds:.morphine, morphine injection, sodium chloride flush Medications Prior to Admission:  Prior to Admission medications   Medication Sig Start Date End Date Taking? Authorizing Provider  metFORMIN (GLUCOPHAGE-XR) 750 MG 24 hr tablet Take 750 mg by mouth daily with breakfast.   Yes [provider]  morphine (MSIR) 15 MG tablet Take 1 tablet (15 mg total) by mouth every 6 (six) hours as needed for severe pain. 01/19/19  Yes Tish Men, MD  Multiple Vitamin (MULTIVITAMIN WITH MINERALS) TABS tablet Take 1 tablet by mouth daily. 01/12/19  Yes Enid Derry, [place], DO  pantoprazole (PROTONIX) 40 MG tablet Take 40 mg by mouth daily.   Yes [provider]  prochlorperazine (COMPAZINE) 10 MG tablet Take 1 tablet (10 mg total) by mouth every 6 (six) hours as needed for nausea or vomiting. 01/19/19  Yes Tish Men, MD  morphine (MS CONTIN) 15 MG 12 hr tablet Take 1 tablet (15 mg total) by mouth every 12 (twelve) hours. 01/28/19 02/27/19  Tish Men, MD  polyethylene glycol (MIRALAX / GLYCOLAX) 17 g packet Take 17 g by mouth daily. Patient not taking: Reported on 01/25/2019 01/12/19   Shirley, [place], DO  senna-docusate (SENOKOT-S) 8.6-50 MG tablet Take 1 tablet by mouth at bedtime. Patient not taking: Reported on 01/26/2019 01/12/19   Shirley, [place], DO  temazepam  (RESTORIL) 15 MG capsule Take 15 mg by mouth at bedtime. 01/23/19   [provider]   No Known Allergies Review of Systems Lack of appetite Some shortness of breath, worse on movement Denies pain  Physical Exam 63 year old gentleman resting in bed Able to speak in full sentences, however becomes tired while caring on long conversations. S1-S2 slightly sinus tachycardia on the monitor Diminished breath sounds at bases Awake alert oriented pleasant and cooperative Able to provide his own history  Vital Signs: BP 129/79 (BP Location: Left Arm)   Pulse (!) 111   Temp 97.9 F (36.6 C) (Oral)   Resp (!) 32   Ht 6\' 1"  (1.854 m)   Wt 107 kg   SpO2 100%   BMI 31.12 kg/m  Pain Scale: 0-10   Pain Score: Asleep   SpO2: SpO2: 100 % O2 Device:SpO2: 100 % O2 Flow Rate: .O2 Flow Rate (L/min): 10 L/min(c/o sob past up to use urinal)  IO: Intake/output summary:   Intake/Output Summary (Last 24 hours) at 02/03/2019 1529 Last data filed at 02/03/2019 1500 Gross per 24 hour  Intake 2216.01 ml  Output 875 ml  Net 1341.01 ml    LBM: Last BM Date: (8/30) Baseline Weight: Weight: 107 kg Most recent weight: Weight: 107 kg     Palliative Assessment/Data:  PPS 40%   Time In:  12 Time Out:  1300 Time Total:  60 Greater than 50%  of this time was spent counseling and coordinating care related to the above assessment and plan.  Signed by: Loistine Chance, MD  [phone] Please contact Palliative Medicine Team phone at [phone] for questions and concerns.  For individual provider: See Shea Evans

## 2019-02-03 NOTE — Progress Notes (Signed)
Pt still noncompliant with telemetry monitoring. Able to get vital signs at 2000. Refusing to be placed on the monitor. Aware of risks, safety policy and procedures. Will continue to monitor.

## 2019-02-03 NOTE — Progress Notes (Signed)
[facility] Davenport  PROGRESS NOTE    [name]  EVO:[id number] DOB: 1956/05/11 DOA: 01/26/2019 PCP: Patient, No Pcp Per   Brief Narrative:   Andrew Davenport a 63 y.o.malewith medical history significant ofStage 4 metastatic adeno carcinoma on hospice care at home follows up with Dr Maylon Peppers , s/p VATS and pleurex catheter on the right who presented with SOB and was found to have a submassive PE.  Pt does not desire any further cancer treatment, but does want to treat the PE at this point.  When he is discharged, he desires to resume hospice.   Palliative care has been consulted.    Assessment & Plan:   Active Problems:   Malignant pleural effusion   Hypoxemia   Hyponatremia   Type 2 diabetes mellitus with chronic kidney disease, without long-term current use of insulin (HCC)   Adenocarcinoma of lung (HCC)   Leukocytosis   Normocytic anemia   Thrombocytosis (HCC)   Acute respiratory failure with hypoxia (HCC)   Acute pulmonary embolus (HCC)   PE (pulmonary thromboembolism) (HCC)   Acute Hypoxic Respiratory Failure and Acute Hypercarbic Respiratory Failure Metastatic Lung Cancer Submassive Pulmonary Embolism     - Likely related to PE was well as small to moderate L effusion and increasing R hydropneumothorax     - Continue drainage of pleurx daily     - Consider thoracentesis of L side, will hold off for now based on discussion with pt     - Continue heparin gtt     - Echo with EF 55-60%, impaired relaxation - he has evidence of increased RV pressure with D shaped IV septum (see report) and mildly reduced systolic function     - LE [place] with bilateral LE DVT's     - Requiring 10 L high flow, BP is normal and HR improved     - Wean O2 as tolerated     - Pulm c/s - now signed off (discussed possibility of catheter directed thrombolytics, but they noted he would not be candidate)  Metastatic adenocarcinoma of the lung      - further management as per Dr Maylon Peppers - he's no longer  interested in chemotherapy     - Dr. Maylon Peppers recommending doac at time of discharge     - appreciate Dr. Lorette Ang assistance  Diabetes Mellitus:     - SSI.      - 6.6  Lactic acidosis      - resolved  Hyponatremia:      - improving, monitor  Leukocytosis and thrombocytosis:      - Probably reactive; stable, no fevers, monitor  Anemia of chronic disease/ malignancy.      - Monitor.  Spoke to patient about possible need for left thoracentesis. He is declining that option for now. Goal at this point would be to wean him down from HFNC. I think our goal should be weaning to regular [place] while being asymptomatic. So if he's sats drop into the 80's, as long as he is aSx, we should be ok.   DVT prophylaxis: heparin gtt Code Status: DNR Family Communication: None at bedside   Disposition Plan: To return home with hospice when O2 wean off HFNC  Consultants:   Oncology  Pulmonology   ROS:  Denies CP, palpitations, N, V. Reports dyspnea. Remainder 10-pt ROS is negative for all not previously mentioned.  Subjective: "That's about right."  Objective: Vitals:   02/03/19 0915 02/03/19 1200 02/03/19 1250 02/03/19 1253  BP:  Pulse:   (!) 113 (!) 111  Resp:      Temp:  97.9 F (36.6 C)    TempSrc:  Oral    SpO2: 98%  100% 100%  Weight:      Height:        Intake/Output Summary (Last 24 hours) at 02/03/2019 1348 Last data filed at 02/03/2019 1300 Gross per 24 hour  Intake 2216.01 ml  Output 525 ml  Net 1691.01 ml   Filed Weights   01/21/2019 1840  Weight: 107 kg    Examination:  General: 63 y.o. male resting in bed in NAD Eyes: PERRL, normal sclera ENMT: Nares patent w/o discharge, orophaynx clear, dentition normal, ears w/o discharge/lesions/ulcers Cardiovascular: tachy, +S1, S2, no m/g/r, equal pulses throughout Respiratory: decreased at bases; mild nasal flaring, increased WOB GI: BS+, NDNT, no masses noted, no organomegaly noted MSK: No e/c/c Skin: No rashes,  bruises, ulcerations noted Neuro: A&O x 3, no focal deficits Psyc: Appropriate interaction and affect, calm/cooperative   Data Reviewed: I have personally reviewed following labs and imaging studies.  CBC: Recent Labs  Lab 01/18/2019 1032 02/01/19 0220 02/02/19 0529 02/03/19 0147  WBC 20.8* 19.6* 16.2* 16.5*  HGB 12.2* 10.8* 10.5* 10.3*  HCT 38.6* 34.7* 33.5* 33.8*  MCV 86.7 86.5 85.9 88.0  PLT 444* 387 398 277*   Basic Metabolic Panel: Recent Labs  Lab 01/22/2019 1032 02/01/19 0220 02/02/19 0529 02/03/19 0147  NA 131* 133* 129* 131*  K 4.4 4.8 4.9 4.7  CL 89* 90* 91* 93*  CO2 25 27 26 27   GLUCOSE 143* 144* 153* 156*  BUN 10 12 11 10   CREATININE 0.78 0.65 0.68 0.69  CALCIUM 9.2 9.3 9.0 9.1  MG  --   --  2.0 2.0   GFR: Estimated Creatinine Clearance: 126 mL/min (by C-G formula based on SCr of 0.69 mg/dL). Liver Function Tests: Recent Labs  Lab 01/04/2019 1032 02/01/19 0220 02/02/19 0529 02/03/19 0147  AST 19 19 28 27   ALT 22 20 23 28   ALKPHOS 101 95 99 125  BILITOT 0.8 0.6 0.8 1.0  PROT 7.5 7.1 7.2 7.0  ALBUMIN 3.3* 2.9* 3.0* 2.9*   No results for input(s): LIPASE, AMYLASE in the last 168 hours. No results for input(s): AMMONIA in the last 168 hours. Coagulation Profile: Recent Labs  Lab 01/23/2019 1459  INR 1.3*   Cardiac Enzymes: No results for input(s): CKTOTAL, CKMB, CKMBINDEX, TROPONINI in the last 168 hours. BNP (last 3 results) No results for input(s): PROBNP in the last 8760 hours. HbA1C: Recent Labs    02/01/19 0220  HGBA1C 6.6*   CBG: Recent Labs  Lab 02/02/19 0825  GLUCAP 140*   Lipid Profile: No results for input(s): CHOL, HDL, LDLCALC, TRIG, CHOLHDL, LDLDIRECT in the last 72 hours. Thyroid Function Tests: No results for input(s): TSH, T4TOTAL, FREET4, T3FREE, THYROIDAB in the last 72 hours. Anemia Panel: No results for input(s): VITAMINB12, FOLATE, FERRITIN, TIBC, IRON, RETICCTPCT in the last 72 hours. Sepsis Labs: Recent Labs   Lab 01/27/2019 1525 01/02/2019 2323 02/01/19 1200  LATICACIDVEN 2.6* 2.5* 1.4    Recent Results (from the past 240 hour(s))  SARS Coronavirus 2 [facility] order, Performed in [facility] hospital lab) Nasopharyngeal Nasopharyngeal Swab     Status: None   Collection Time: 02/01/2019  2:59 PM   Specimen: Nasopharyngeal Swab  Result Value Ref Range Status   SARS Coronavirus 2 NEGATIVE NEGATIVE Final    Comment: (NOTE) If result is NEGATIVE SARS-CoV-2 target nucleic  acids are NOT DETECTED. The SARS-CoV-2 RNA is generally detectable in upper and lower  respiratory specimens during the acute phase of infection. The lowest  concentration of SARS-CoV-2 viral copies this assay can detect is 250  copies / mL. A negative result does not preclude SARS-CoV-2 infection  and should not be used as the sole basis for treatment or other  patient management decisions.  A negative result may occur with  improper specimen collection / handling, submission of specimen other  than nasopharyngeal swab, presence of viral mutation(s) within the  areas targeted by this assay, and inadequate number of viral copies  (<250 copies / mL). A negative result must be combined with clinical  observations, patient history, and epidemiological information. If result is POSITIVE SARS-CoV-2 target nucleic acids are DETECTED. The SARS-CoV-2 RNA is generally detectable in upper and lower  respiratory specimens dur ing the acute phase of infection.  Positive  results are indicative of active infection with SARS-CoV-2.  Clinical  correlation with patient history and other diagnostic information is  necessary to determine patient infection status.  Positive results do  not rule out bacterial infection or co-infection with other viruses. If result is PRESUMPTIVE POSTIVE SARS-CoV-2 nucleic acids MAY BE PRESENT.   A presumptive positive result was obtained on the submitted specimen  and confirmed on repeat testing.  While 2019  novel coronavirus  (SARS-CoV-2) nucleic acids may be present in the submitted sample  additional confirmatory testing may be necessary for epidemiological  and / or clinical management purposes  to differentiate between  SARS-CoV-2 and other Sarbecovirus currently known to infect humans.  If clinically indicated additional testing with an alternate test  methodology [phone]) is advised. The SARS-CoV-2 RNA is generally  detectable in upper and lower respiratory sp ecimens during the acute  phase of infection. The expected result is Negative. Fact Sheet for Patients:  StrictlyIdeas.no Fact Sheet for Healthcare Providers: BankingDealers.co.za This test is not yet approved or cleared by the [place] FDA and has been authorized for detection and/or diagnosis of SARS-CoV-2 by FDA under an Emergency Use Authorization (EUA).  This EUA will remain in effect (meaning this test can be used) for the duration of the COVID-19 declaration under Section 564(b)(1) of the Act, 21 U.S.C. section 360bbb-3(b)(1), unless the authorization is terminated or revoked sooner. Performed at [facility], [place] [address] [place], [place] [address]       Radiology Studies: No results found.   Scheduled Meds: . Chlorhexidine Gluconate Cloth  6 each Topical Daily  . Chlorhexidine Gluconate Cloth  6 each Topical Daily  . ipratropium-albuterol  3 mL Nebulization Q6H  . mouth rinse  15 mL Mouth Rinse BID  . morphine  15 mg Oral Q12H  . pantoprazole  40 mg Oral BID AC  . temazepam  15 mg Oral QHS   Continuous Infusions: . sodium chloride 75 mL/hr at 02/03/19 0420  . heparin 2,300 Units/hr (02/03/19 0420)     LOS: 3 days    Time spent: 25 minutes spent in the coordination of care today.    Jonnie Finner, DO [facility] Pager [phone]  If 7PM-7AM, please contact night-coverage [email or web address] Password [email or web address] 02/03/2019,  1:48 PM

## 2019-02-03 NOTE — Progress Notes (Signed)
ANTICOAGULATION CONSULT NOTE -  Consult  Pharmacy Consult for Heparin Indication: pulmonary embolus & DVT  No Known Allergies  Patient Measurements: Height: 6\' 1"  (185.4 cm) Weight: 235 lb 14.3 oz (107 kg) IBW/kg (Calculated) : 79.9 Heparin Dosing Weight: 88 kg  Vital Signs: Temp: 97.9 F (36.6 C) (09/02 0800) Temp Source: Oral (09/02 0800) BP: 129/79 (09/02 0800) Pulse Rate: 110 (09/02 0800)  Labs: Recent Labs    01/12/2019 1459  02/01/19 0220  02/02/19 0529 02/03/19 0147 02/03/19 1106  HGB  --    < > 10.8*  --  10.5* 10.3*  --   HCT  --   --  34.7*  --  33.5* 33.8*  --   PLT  --   --  387  --  398 426*  --   APTT 32  --   --   --   --   --   --   LABPROT 16.5*  --   --   --   --   --   --   INR 1.3*  --   --   --   --   --   --   HEPARINUNFRC  --    < >  --    < > 0.42 0.25* 0.58  CREATININE  --   --  0.65  --  0.68 0.69  --    < > = values in this interval not displayed.   Estimated Creatinine Clearance: 126 mL/min (by C-G formula based on SCr of 0.69 mg/dL).  Medical History: Past Medical History:  Diagnosis Date  . Diabetes mellitus without complication ([place])   . GERD (gastroesophageal reflux disease)    Medications:  No anticoagulation PTA  Assessment: 63 yr male presents with shortness of breath.  PMH signficant for stage 4 lung cancer. CTAngio shows multiple left segmental pulmonary emboli. Pharmacy consulted to dose IV heparin, begin with 4000 unit bolus, 1700 units/hr.    8/31: AM hep level low, rate increased, patient pulled IV out x2, last at 0230 Heparin resumed at 0521 at 1900 units/hr, 2p level ~ low, rate increased to 2150 units/hr 2147 HL = 0.13 units/hr- patient has been pulling IV out and RN stopped at 2130 for occluded line. HL was drawn soon after. No bleeding noted by RN. Re-bolused with 2000 units/hr  9/1: HL 0.42 this am, on 2150 units/hr, in goal range H/H low/stable. Plt wnl Less agitated today  Today, 9/2 0147 HL = 0.25 below goal,  no IV interuptions or bleeding per RN, rate increase to 2300 units/hr 1106 HL = 0.58 units/ml, in goal range on 2300 units/ml  Goal of Therapy:  Heparin level 0.3-0.7 units/ml Monitor platelets by anticoagulation protocol: Yes   Plan:   Continue Heparin infusion at 2300 units/hr  Daily heparin level and CBC while on heparin  Ongoing discussion for discharge, possible hospice facility  Graylin Shiver L 02/03/2019, 11:59 AM

## 2019-02-04 DIAGNOSIS — Z515 Encounter for palliative care: Secondary | ICD-10-CM

## 2019-02-04 DIAGNOSIS — Z7189 Other specified counseling: Secondary | ICD-10-CM | POA: Diagnosis not present

## 2019-02-04 DIAGNOSIS — C3491 Malignant neoplasm of unspecified part of right bronchus or lung: Secondary | ICD-10-CM | POA: Diagnosis not present

## 2019-02-04 DIAGNOSIS — J9601 Acute respiratory failure with hypoxia: Secondary | ICD-10-CM | POA: Diagnosis not present

## 2019-02-04 DIAGNOSIS — I2609 Other pulmonary embolism with acute cor pulmonale: Secondary | ICD-10-CM | POA: Diagnosis not present

## 2019-02-04 DIAGNOSIS — J910 Malignant pleural effusion: Secondary | ICD-10-CM | POA: Diagnosis not present

## 2019-02-04 DIAGNOSIS — R0602 Shortness of breath: Secondary | ICD-10-CM | POA: Diagnosis not present

## 2019-02-04 LAB — CBC
HCT: 31.7 % — ABNORMAL LOW (ref 39.0–52.0)
Hemoglobin: 9.8 g/dL — ABNORMAL LOW (ref 13.0–17.0)
MCH: 27.3 pg (ref 26.0–34.0)
MCHC: 30.9 g/dL (ref 30.0–36.0)
MCV: 88.3 fL (ref 80.0–100.0)
Platelets: 453 10*3/uL — ABNORMAL HIGH (ref 150–400)
RBC: 3.59 MIL/uL — ABNORMAL LOW (ref 4.22–5.81)
RDW: 15.4 % (ref 11.5–15.5)
WBC: 16.7 10*3/uL — ABNORMAL HIGH (ref 4.0–10.5)
nRBC: 0 % (ref 0.0–0.2)

## 2019-02-04 LAB — HEPARIN LEVEL (UNFRACTIONATED): Heparin Unfractionated: 0.68 IU/mL (ref 0.30–0.70)

## 2019-02-04 MED ORDER — LORAZEPAM 2 MG/ML IJ SOLN
1.0000 mg | Freq: Four times a day (QID) | INTRAMUSCULAR | Status: DC | PRN
  Administered 2019-02-04: 1 mg via INTRAVENOUS
  Filled 2019-02-04: qty 1

## 2019-02-04 MED ORDER — HYDROMORPHONE HCL 1 MG/ML IJ SOLN
1.0000 mg | INTRAMUSCULAR | Status: DC | PRN
  Administered 2019-02-04: 1 mg via INTRAVENOUS
  Filled 2019-02-04 (×2): qty 1

## 2019-03-04 NOTE — Progress Notes (Addendum)
Paged by RN for family support around change in condition / end of life.    Provided support at bedside with pt's mother and sister.  Chaplain engaged in narrative review, honoring stories and values that pt has lived.  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  Mom reports that [place] and spent time in [place] while in the [employer].    Mother expressed gratefulness that she was able to come to hospital while Jordan was still able to respond to her presence.    Rayven awoke briefly during our encounter.  Was receptive to chaplain presence and engaged with family.     Chaplains are available 24 hours to support this family.  This chaplain will follow up to assess for support needs through shift.  Please page 24 pager at [phone] as needs arise.     Jerene Pitch, MDiv, [facility]

## 2019-03-04 NOTE — Progress Notes (Signed)
ANTICOAGULATION CONSULT NOTE -  Consult  Pharmacy Consult for Heparin Indication: pulmonary embolus & DVT  No Known Allergies  Patient Measurements: Height: 6\' 1"  (185.4 cm) Weight: 235 lb 14.3 oz (107 kg) IBW/kg (Calculated) : 79.9 Heparin Dosing Weight: 88 kg  Vital Signs: Temp: 98 F (36.7 C) (09/03 0000) Temp Source: Oral (09/03 0000) BP: 144/84 (09/03 0517)  Labs: Recent Labs    02/02/19 0529 02/03/19 0147 02/03/19 1106 15-Feb-2019 0458  HGB 10.5* 10.3*  --  9.8*  HCT 33.5* 33.8*  --  31.7*  PLT 398 426*  --  453*  HEPARINUNFRC 0.42 0.25* 0.58 0.68  CREATININE 0.68 0.69  --   --    Estimated Creatinine Clearance: 126 mL/min (by C-G formula based on SCr of 0.69 mg/dL).  Medical History: Past Medical History:  Diagnosis Date  . Diabetes mellitus without complication ([place])   . GERD (gastroesophageal reflux disease)    Medications:  No anticoagulation PTA  Assessment: 63 yr male presents with shortness of breath.  PMH signficant for stage 4 lung cancer. CTAngio shows multiple left segmental pulmonary emboli. Pharmacy consulted to dose IV heparin, begin with 4000 unit bolus, 1700 units/hr.    02/15/2019 HL 0.68 therapeutic H/H low but stable No bleeding noted  Goal of Therapy:  Heparin level 0.3-0.7 units/ml Monitor platelets by anticoagulation protocol: Yes   Plan:   Continue Heparin infusion at 2300 units/hr  Daily heparin level and CBC while on heparin  Ongoing discussion for discharge, possible hospice facility  [place] 02/15/19, 7:24 AM Pager [phone]

## 2019-03-04 NOTE — Progress Notes (Addendum)
Andrew Davenport  PROGRESS NOTE    Andrew Davenport  VWU:[id number] DOB: 02/05/1956 DOA: 01/04/2019 PCP: Patient, No Pcp Per   Brief Narrative:   Andrew Davenport a 63 y.o.malewith medical history significant ofStage 4 metastatic adeno carcinoma on hospice care at home follows up with Dr Maylon Peppers , s/p VATS and pleurex catheter on the rightwho presented with SOB and was found to have a submassive PE. Pt does not desire any further cancer treatment, but does want to treat the PE at this point. When he is discharged, he desires to resume hospice.   Palliative care has been consulted.   Assessment & Plan:   Active Problems:   Malignant pleural effusion   Hypoxemia   Hyponatremia   Type 2 diabetes mellitus with chronic kidney disease, without long-term current use of insulin (HCC)   Adenocarcinoma of lung (HCC)   Leukocytosis   Normocytic anemia   Thrombocytosis (HCC)   Acute respiratory failure with hypoxia (HCC)   Acute pulmonary embolus (HCC)   PE (pulmonary thromboembolism) (HCC)   Acute Hypoxic Respiratory Failure and Acute Hypercarbic Respiratory Failure Metastatic Lung Cancer Submassive Pulmonary Embolism     - Likely related to PE was well as small to moderate L effusion and increasing R hydropneumothorax     - Continue drainage of pleurx daily     - Consider thoracentesis of L side, will hold off for now based on discussion with pt     - Continue heparin gtt     - Echo with EF 55-60%, impaired relaxation - he has evidence of increased RV pressure with D shaped IV septum (see report) and mildly reduced systolic function     - LE [place] with bilateral LE DVT's     - Requiring 10 L high flow, BP is normal and HR improved     - Wean O2 as tolerated     - Pulm c/s - now signed off (discussed possibility of catheter directed thrombolytics, but they noted he would not be candidate)  Metastatic adenocarcinoma of the lung      - further management as per Dr Maylon Peppers - he's no longer  interested in chemotherapy     - Dr. Maylon Peppers recommending doac at time of discharge     - appreciate Dr. Lorette Ang assistance  Diabetes Mellitus:     - SSI.      - 6.6  Lactic acidosis      - resolved  Hyponatremia:      - improving, monitor  Leukocytosis and thrombocytosis:      - Probably reactive; stable, no fevers, monitor  Anemia of chronic disease/ malignancy.      - Monitor.  Spoke to patient about possible need for left thoracentesis. He is declining that option for now. He's weaned some today, but looks more uncomfortable today. He's sats are maintaining. We had a long discussion about the ultimate cause of his dyspnea. We talked about how the clot is playing a role, but his malignancy/malignant effusion is the bigger problem. He does not want thoracentesis for his left effusion. This will only get worse. We talked about how anticoagulation gives the body time to chew up the clot, but this will take time. We talked about the pros and cons of continuing treatment for his clot and how this fits into his quality of life desires. He has made the decision to stop treatment for his pulmonary embolism (witnessed by his nurse today). He wishes to return to hospice. I  has PRN morphine q2h. I have added PRN ativan q6h. I have discussed the situation with his sister. She agrees with his decision and wants him to be as comfortable as possible.   DVT prophylaxis: none, elected comfort care measures. Code Status: DNR Family Communication: Spoke sister by phone  Disposition Plan: To return to hospice: facility vs home  Consultants:   Oncology  Pulmonology  ROS:  Denies CP, N, V, palpitations. Reports anxiety, dyspnea. Remainder 10-pt ROS is negative for all not previously mentioned.  Subjective: "I don't want to feel like I can't breathe."  Objective: Vitals:   Feb 10, 2019 0517 02-10-2019 0731 Feb 10, 2019 0810 2019-02-10 0843  BP: (!) 144/84 127/82    Pulse:  (!) 110    Resp:       Temp:   97.7 F (36.5 C)   TempSrc:   Oral   SpO2: 98% 96%  90%  Weight:      Height:        Intake/Output Summary (Last 24 hours) at 02/10/19 0908 Last data filed at 02/10/2019 0846 Gross per 24 hour  Intake 2494.82 ml  Output 1225 ml  Net 1269.82 ml   Filed Weights   01/23/2019 1840  Weight: 107 kg    Examination:  General: 63 y.o. male resting in bed, somewhat anxious Eyes: PERRL, normal sclera ENMT: Nares patent w/o discharge, orophaynx clear, dentition normal, ears w/o discharge/lesions/ulcers Cardiovascular: tachy, +S1, S2, no m/g/r, equal pulses throughout Respiratory: decreased at bases; nasal flaring, increased WOB GI: BS+, NDNT, no masses noted, no organomegaly noted MSK: No e/c/c Skin: No rashes, bruises, ulcerations noted Neuro: A&O x 3, no focal deficits Psyc: Appropriate interaction and affect, calm/cooperative   Data Reviewed: I have personally reviewed following labs and imaging studies.  CBC: Recent Labs  Lab 01/30/2019 1032 02/01/19 0220 02/02/19 0529 02/03/19 0147 02-10-19 0458  WBC 20.8* 19.6* 16.2* 16.5* 16.7*  HGB 12.2* 10.8* 10.5* 10.3* 9.8*  HCT 38.6* 34.7* 33.5* 33.8* 31.7*  MCV 86.7 86.5 85.9 88.0 88.3  PLT 444* 387 398 426* 767*   Basic Metabolic Panel: Recent Labs  Lab 01/27/2019 1032 02/01/19 0220 02/02/19 0529 02/03/19 0147  NA 131* 133* 129* 131*  K 4.4 4.8 4.9 4.7  CL 89* 90* 91* 93*  CO2 25 27 26 27   GLUCOSE 143* 144* 153* 156*  BUN 10 12 11 10   CREATININE 0.78 0.65 0.68 0.69  CALCIUM 9.2 9.3 9.0 9.1  MG  --   --  2.0 2.0   GFR: Estimated Creatinine Clearance: 126 mL/min (by C-G formula based on SCr of 0.69 mg/dL). Liver Function Tests: Recent Labs  Lab 01/12/2019 1032 02/01/19 0220 02/02/19 0529 02/03/19 0147  AST 19 19 28 27   ALT 22 20 23 28   ALKPHOS 101 95 99 125  BILITOT 0.8 0.6 0.8 1.0  PROT 7.5 7.1 7.2 7.0  ALBUMIN 3.3* 2.9* 3.0* 2.9*   No results for input(s): LIPASE, AMYLASE in the last 168 hours. No  results for input(s): AMMONIA in the last 168 hours. Coagulation Profile: Recent Labs  Lab 01/03/2019 1459  INR 1.3*   Cardiac Enzymes: No results for input(s): CKTOTAL, CKMB, CKMBINDEX, TROPONINI in the last 168 hours. BNP (last 3 results) No results for input(s): PROBNP in the last 8760 hours. HbA1C: No results for input(s): HGBA1C in the last 72 hours. CBG: Recent Labs  Lab 02/02/19 0825  GLUCAP 140*   Lipid Profile: No results for input(s): CHOL, HDL, LDLCALC, TRIG, CHOLHDL, LDLDIRECT in the last  72 hours. Thyroid Function Tests: No results for input(s): TSH, T4TOTAL, FREET4, T3FREE, THYROIDAB in the last 72 hours. Anemia Panel: No results for input(s): VITAMINB12, FOLATE, FERRITIN, TIBC, IRON, RETICCTPCT in the last 72 hours. Sepsis Labs: Recent Labs  Lab 01/26/2019 1525 01/19/2019 2323 02/01/19 1200  LATICACIDVEN 2.6* 2.5* 1.4    Recent Results (from the past 240 hour(s))  SARS Coronavirus 2 [facility] order, Performed in [facility] hospital lab) Nasopharyngeal Nasopharyngeal Swab     Status: None   Collection Time: 01/17/2019  2:59 PM   Specimen: Nasopharyngeal Swab  Result Value Ref Range Status   SARS Coronavirus 2 NEGATIVE NEGATIVE Final    Comment: (NOTE) If result is NEGATIVE SARS-CoV-2 target nucleic acids are NOT DETECTED. The SARS-CoV-2 RNA is generally detectable in upper and lower  respiratory specimens during the acute phase of infection. The lowest  concentration of SARS-CoV-2 viral copies this assay can detect is 250  copies / mL. A negative result does not preclude SARS-CoV-2 infection  and should not be used as the sole basis for treatment or other  patient management decisions.  A negative result may occur with  improper specimen collection / handling, submission of specimen other  than nasopharyngeal swab, presence of viral mutation(s) within the  areas targeted by this assay, and inadequate number of viral copies  (<250 copies / mL). A negative  result must be combined with clinical  observations, patient history, and epidemiological information. If result is POSITIVE SARS-CoV-2 target nucleic acids are DETECTED. The SARS-CoV-2 RNA is generally detectable in upper and lower  respiratory specimens dur ing the acute phase of infection.  Positive  results are indicative of active infection with SARS-CoV-2.  Clinical  correlation with patient history and other diagnostic information is  necessary to determine patient infection status.  Positive results do  not rule out bacterial infection or co-infection with other viruses. If result is PRESUMPTIVE POSTIVE SARS-CoV-2 nucleic acids MAY BE PRESENT.   A presumptive positive result was obtained on the submitted specimen  and confirmed on repeat testing.  While 2019 novel coronavirus  (SARS-CoV-2) nucleic acids may be present in the submitted sample  additional confirmatory testing may be necessary for epidemiological  and / or clinical management purposes  to differentiate between  SARS-CoV-2 and other Sarbecovirus currently known to infect humans.  If clinically indicated additional testing with an alternate test  methodology [phone]) is advised. The SARS-CoV-2 RNA is generally  detectable in upper and lower respiratory sp ecimens during the acute  phase of infection. The expected result is Negative. Fact Sheet for Patients:  StrictlyIdeas.no Fact Sheet for Healthcare Providers: BankingDealers.co.za This test is not yet approved or cleared by the [place] FDA and has been authorized for detection and/or diagnosis of SARS-CoV-2 by FDA under an Emergency Use Authorization (EUA).  This EUA will remain in effect (meaning this test can be used) for the duration of the COVID-19 declaration under Section 564(b)(1) of the Act, 21 U.S.C. section 360bbb-3(b)(1), unless the authorization is terminated or revoked sooner. Performed at [facility], [place] [address] [place], [place] [address]       Radiology Studies: No results found.   Scheduled Meds: . Chlorhexidine Gluconate Cloth  6 each Topical Daily  . Chlorhexidine Gluconate Cloth  6 each Topical Daily  . ipratropium-albuterol  3 mL Nebulization Q6H  . mouth rinse  15 mL Mouth Rinse BID  . morphine  15 mg Oral Q12H  . pantoprazole  40 mg Oral BID AC  . temazepam  15 mg Oral QHS   Continuous Infusions: . heparin Stopped (2019/02/14 0840)     LOS: 4 days    Time spent: 45 minutes spent in the coordination of care today; greater than 50% of the time spent counseling pt and family on pathophysiology and treatment of clots, his current cancer status, and goals of care.    Jonnie Finner, DO [facility] Pager [phone]  If 7PM-7AM, please contact night-coverage [email or web address] Password [email or web address] 2019-02-14, 9:08 AM

## 2019-03-04 NOTE — Progress Notes (Signed)
[facility] 1238 - [facility]) Hospitalized Hospice GIP RN Note:  This is a related hospital admission with a hospice diagnosis of lung cancer per Dr. Tomasa Hosteller of [place]. Patient was experiencing SOB and initiated EMS due to distress instead of waiting to see an [facility] RN in the home. Patient is a Full Code and was admitted to the hospital  for acute respiratory failure and segmental PE.    Spoke with beside RN who state's patient is becoming intermittently confused. He has chosen to stop the heparin drip and declined a thoracentesis. Patient and family wish to pursue comfort care only and have requested [facility].   VS: 97.7, 144/84, 115, 91% 3Lnc ( patient has requested reduced O2) I/O: 2388.12/1223 Labs: WBC 16.7, RBC 3.59, Hgb 9.8, HCT 31.7, Platelets 453  Medications: no continuous medications PRNs: Dilaudid 1mg  IV Q2hrs @1321 , Ativan 1mg  IV Q6hrs @ 0515, Morphine 2mg  Q3hrs @ 0746   Per MD notes: We talked about the pros and cons of continuing treatment for his clot and how this fits into his quality of life desires. He has made the decision to stop treatment for his pulmonary embolism (witnessed by his nurse today). He wishes to return to hospice. I has PRN morphine q2h. I have added PRN ativan q6h. I have discussed the situation with his sister. She agrees with his decision and wants him to be as comfortable as possible.   Recommendations/Plan:  comfort measures Patient has elected to d/c heparin Agree with Ativan Opioid rotate to IV Dilaudid and monitor Discussed with sister [name], she wishes to get in touch with [facility] liaison as soon as possible, she wishes for the patient to go to [facility] as soon as possible.  Continue comfort measures Chaplain consult Comfort cart if family arrives at bedside Appreciate bedside RN assistance, continue current scope of care Prognosis appears to be less than 2 weeks at this time.  Goals of Care and Additional  Recommendations: ? Limitations on Scope of Treatment: Full Comfort Care   D/C Planning : Disposition Plan residential hospice. [facility] [place]: Patient DNR - Has transitioned to comfort care only. He is refusing aggressive treamtments IDT: Updated  Family: Spoke with patient sister [name]- listened and supported.   Please call [facility] for transportation needs of [facility] patients.  Thank you, Farrel Gordon, RN, CCM [place] (listed on [place]) [phone]

## 2019-03-04 NOTE — Progress Notes (Signed)
Pt notified RN that he needed assistance. Upon entering room, pt had increased work of breathing, and RN turned O2 back up to 10 L. Pt back on 10L resting comfortably. RN will continue to monitor.

## 2019-03-04 NOTE — Progress Notes (Signed)
[facility]) Hospital Liaison Note.  [facility] is currently working with [place] to get approval for this patient to transfer to residential hospice at [facility]. [place] does not have a room available to day.   [facility] hospital liaison will continue to follow and provide updates as they are available.  Please call if you have any questions.  Farrel Gordon, RN, [facility] Liaison ( listed under [facility] on [place]) [phone]

## 2019-03-04 NOTE — Progress Notes (Signed)
Daily Progress Note   Patient Name: Andrew Davenport       Date: Mar 02, 2019 DOB: [birth year]  Age: 63 y.o. MRN#: [id number] Attending Physician: Jonnie Finner, DO Primary Care Physician: Patient, No Pcp Per Admit Date: 01/17/2019  Reason for Consultation/Follow-up: Disposition, Establishing goals of care, Non pain symptom management and Pain control  Subjective:  patient is awake, sitting up in bed. He feels anxious, he has "air hunger", feels like he is not getting enough air. Patient was noted to have become confused earlier this am. He has elected to discontinue Heparin.   See below.   Length of Stay: 4  Current Medications: Scheduled Meds:  . Chlorhexidine Gluconate Cloth  6 each Topical Daily  . Chlorhexidine Gluconate Cloth  6 each Topical Daily  . ipratropium-albuterol  3 mL Nebulization Q6H  . mouth rinse  15 mL Mouth Rinse BID  . morphine  15 mg Oral Q12H  . pantoprazole  40 mg Oral BID AC  . temazepam  15 mg Oral QHS    Continuous Infusions: . heparin Stopped (2019-03-02 0840)    PRN Meds: HYDROmorphone (DILAUDID) injection, LORazepam, morphine, sodium chloride flush  Physical Exam         Appears anxious Has tachycardia, S1 S2 Increased work of breathing noted Diminished breath sounds No edema Awake reasonably alert and interacting appropriately Abdomen not tender  Vital Signs: BP 127/82   Pulse (!) 110   Temp 97.7 F (36.5 C) (Oral)   Resp (!) 32   Ht 6\' 1"  (1.854 m)   Wt 107 kg   SpO2 90%   BMI 31.12 kg/m  SpO2: SpO2: 90 % O2 Device: O2 Device: Nasal Cannula O2 Flow Rate: O2 Flow Rate (L/min): 0 L/min(Pt requested removal of [place])  Intake/output summary:   Intake/Output Summary (Last 24 hours) at 03-02-2019 1235 Last data filed at 02-Mar-2019 0846 Gross  per 24 hour  Intake 2494.82 ml  Output 1025 ml  Net 1469.82 ml   LBM: Last BM Date: 02/02/19(per pt report) Baseline Weight: Weight: 107 kg Most recent weight: Weight: 107 kg       Palliative Assessment/Data:      Patient Active Problem List   Diagnosis Date Noted  . PE (pulmonary thromboembolism) ([place])   . Acute respiratory failure with hypoxia ([place]) 01/28/2019  .  Acute pulmonary embolus ([place]) 02/01/2019  . Leukocytosis 01/27/2019  . Normocytic anemia 01/27/2019  . Thrombocytosis ([place]) 01/27/2019  . Adenocarcinoma of lung ([place])   . Hyponatremia   . Type 2 diabetes mellitus with chronic kidney disease, without long-term current use of insulin ([place])   . Palliative care encounter   . Hypoxemia   . Non-small cell lung cancer ([place])   . Goals of care, counseling/discussion   . Malignant pleural effusion 01/06/2019    Palliative Care Assessment & Plan   Patient Profile:    Assessment:  Andrew Alligood Williamsis a 63 y.o.malewith medical history significant ofStage 4 metastatic adeno carcinoma on hospice care at home follows up with Dr Maylon Peppers , s/p VATS and pleurex catheter on the rightwho presented with SOB and was found to have a submassive PE. Pt does not desire any further cancer treatment, and in am of 02/11/2019, he elected to d/c heparin drip. Patient wishes for comfort care. Discussed with sister, see below.   Recommendations/Plan:  comfort measures Patient has elected to d/c heparin Agree with Ativan Opioid rotate to IV Dilaudid and monitor Discussed with sister Andrew Davenport, she wishes to get in touch with [facility] liaison as soon as possible, she wishes for the patient to go to [facility] as soon as possible.  Continue comfort measures Chaplain consult Comfort cart if family arrives at bedside Appreciate bedside RN assistance, continue current scope of care Prognosis appears to be less than 2 weeks at this time.  Goals of Care and Additional Recommendations:  Limitations  on Scope of Treatment: Full Comfort Care  Code Status:    Code Status Orders  (From admission, onward)         Start     Ordered   01/24/2019 2002  Do not attempt resuscitation (DNR)  Continuous    Question Answer Comment  In the event of cardiac or respiratory ARREST Do not call a "code blue"   In the event of cardiac or respiratory ARREST Do not perform Intubation, CPR, defibrillation or ACLS   In the event of cardiac or respiratory ARREST Use medication by any route, position, wound care, and other measures to relive pain and suffering. May use oxygen, suction and manual treatment of airway obstruction as needed for comfort.      01/08/2019 2001        Code Status History    Date Active Date Inactive Code Status Order ID Comments User Context   01/11/2019 1659 01/08/2019 1915 Full Code [id number]  Hosie Poisson, MD Inpatient   01/20/2019 1643 02/01/2019 1659 DNR [id number]  Tanda Rockers, MD Inpatient   01/10/2019 1501 01/13/2019 1605 DNR [id number]  Fuller Canada, PA-C Inpatient   01/06/2019 1711 01/10/2019 1501 Full Code [id number]  Shirley, [place], DO ED   Advance Care Planning Activity    Advance Directive Documentation     Most Recent Value  Type of Advance Directive  Living will, Healthcare Power of [place], Out of facility DNR (pink MOST or yellow form)  Pre-existing out of facility DNR order (yellow form or pink MOST form)  -  "MOST" Form in Place?  -       Prognosis:   Hours - Days  Discharge Planning:  Hospice facility  Care plan was discussed with  Patient, also discussed with sister Andrew Davenport on the phone.   Thank you for allowing the Palliative Medicine Team to assist in the care of this patient.   Time In: 12 Time Out: 12.35 Total  Time 35 Prolonged Time Billed  no       Greater than 50%  of this time was spent counseling and coordinating care related to the above assessment and plan.  Loistine Chance, MD [phone] Please contact Palliative Medicine Team  phone at [phone] for questions and concerns.

## 2019-03-04 NOTE — Progress Notes (Signed)
Pt requesting oxygen to be turned down from 10 L to 5 L. RN advised pt his pulse ox reading was currently at 89% on 10 L and RN can turn down to 7L. Pt said " Just do what I tell you to do." After education and persistence, RN and pt agreed to turn down oxygen to 7L. RN will continue to monitor.

## 2019-03-04 NOTE — Progress Notes (Addendum)
Time of death noted at 1710 by two RNs.  Pt was not alone at time of death.  This RN and a tech was with pt, holding his hands and speaking with him; pt was in no distress or any apparent discomfort.  Passing happened very quickly after an acute event of sitting on the edge of the bed and being alert, though disoriented. Paged Dr. Marylyn Ishihara and updated at 1713.  Called pt's sister, Brighton Pilley and updated on pt's status at 8.  Chaplin services was notified at 1716 and advised they would be on the way. [facility] was called at [place] [employer]; was advised he is a possible eye donation candidate. Spoke with [facility], Bevely Palmer, at [phone] who is aware of pt's passing and will be available to support family in the upcoming days.  Family arrived at bedside at 1748 and is sitting with the pt. Chaplin arrived at bedside at 1800.

## 2019-03-04 NOTE — Death Summary Note (Signed)
Notified by nursing that patient has expired.   Per notice: Time of death noted at 1710 by two RNs.  Pt was not alone at time of death.  This RN and a tech was with pt, holding his hands and speaking with him; pt was in no distress or any apparent discomfort.  Passing happened very quickly after an acute event of sitting on the edge of the bed and being alert, though disoriented. Paged Dr. Marylyn Ishihara and updated at 1713.  Called pt's sister, Keola Heninger and updated on pt's status at 76.  [facility]in services was notified at 1716 and advised they would be on the way. [facility] was called at [employer]; was advised he is a possible eye donation candidate. Spoke with [facility], Bevely Palmer, at [phone] who is aware of pt's passing and will be available to support family in the upcoming days.  Family arrived at bedside at 1748 and is sitting with the pt. Chaplin arrived at bedside at 1800.   Discharge Diagnosis: Acute Hypoxic Respiratory Failure and Acute Hypercarbic Respiratory Failure Metastatic adenocarcinoma of the lung  Diabetes Mellitus: Lactic acidosis  Hyponatremia:  Leukocytosis and thrombocytosis:  Anemia of chronic disease/ malignancy.    Death was expected. As per noted from today: Spoke to patient about possible need for left thoracentesis. He is declining that option for now. He's weaned some today, but looks more uncomfortable today. He's sats are maintaining. We had a long discussion about the ultimate cause of his dyspnea. We talked about how the clot is playing a role, but his malignancy/malignant effusion is the bigger problem. He does not want thoracentesis for his left effusion. This will only get worse. We talked about how anticoagulation gives the body time to chew up the clot, but this will take time. We talked about the pros and cons of continuing treatment for his clot and how this fits into his quality of life desires. He has made the decision to stop treatment  for his pulmonary embolism (witnessed by his nurse today). He wishes to return to hospice. I has PRN morphine q2h. I have added PRN ativan q6h. I have discussed the situation with his sister. She agrees with his decision and wants him to be as comfortable as possible.  Comfort care medications were instituted this morning in expectation.   Upon entering the room, patient was unresponsive to verbal or noxious stimulation. He was absent heart and breath sounds. He was absent carotid, radial pulses. He was absent corneal reflex.   TOD 1710 of 2019/02/15.  Marland KitchenJonnie Finner, DO

## 2019-03-04 NOTE — Progress Notes (Signed)
Pt's sister (one of two) was bedside when I arrived. I met his mother at the elevator as she was leaving. Pt's sister was appropriately overtaken with grief. She talked about the fact that pt wanted to be cremated and they would like to use [employer]. She had support from a friend (I was told Doristine Bosworth). Though she was very tearful she maintained control. He was the only brother, and apparently "spoiled" according to her description. I provided emotional and grief support and presence until she and her support system left the unit. They were very appreciative of Chaplain support that the had received all day. [place], [place]   03/03/2019 1800  Clinical Encounter Type  Visited With Family

## 2019-03-04 DEATH — deceased

## 2019-04-04 DEATH — deceased

## 2019-07-21 LAB — BLOOD GAS, VENOUS
Acid-Base Excess: 3.2 mmol/L — ABNORMAL HIGH (ref 0.0–2.0)
Bicarbonate: 29.2 mmol/L — ABNORMAL HIGH (ref 20.0–28.0)
O2 Content: 10 L/min
O2 Saturation: 44.7 %
Patient temperature: 98.3

## 2019-09-30 IMAGING — CT CT ANGIOGRAPHY CHEST
1 of 6 series · 2 of 16 positions shown · IV contrast (omnipaque)
Comparison: 01/06/2019 CT

CLINICAL DATA: 62-year-old male with shortness of breath. History
of non-small cell lung cancer and malignant pleural effusion.
Patient with RIGHT pleural catheter.

EXAM:
CT ANGIOGRAPHY CHEST WITH CONTRAST
TECHNIQUE: Multidetector CT imaging of the chest was performed using the
standard protocol during bolus administration of intravenous
contrast. Multiplanar CT image reconstructions and MIPs were
obtained to evaluate the vascular anatomy.
CONTRAST:  100mL OMNIPAQUE IOHEXOL 350 MG/ML SOLN

[Series 8: thins · axial · 0.76mm/px · z∈[+78,+179]mm · 2 of 303 slices shown]
[im 101/303  lung]
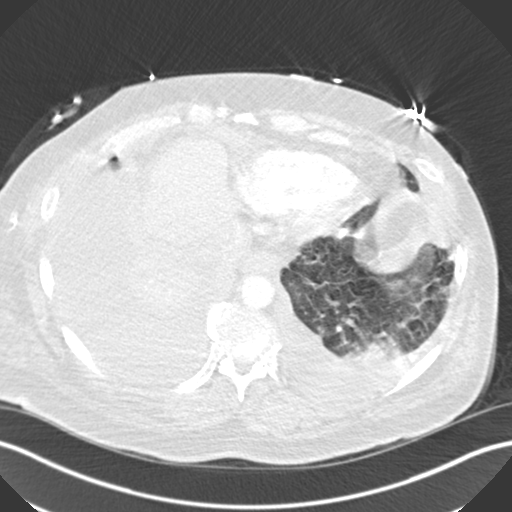
[im 202/303  soft-tissue]
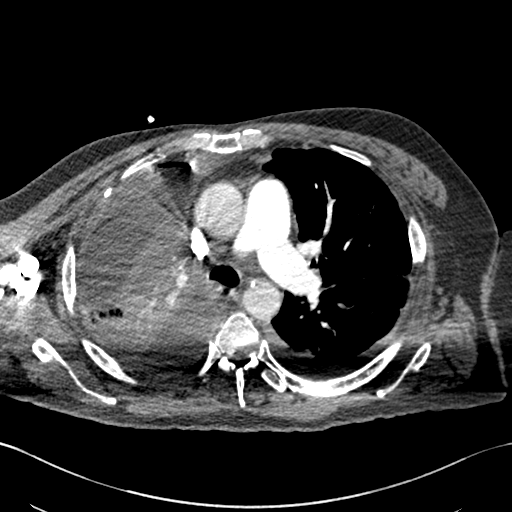

[2 of 16 positions shown; findings below may reference images not displayed]

FINDINGS: Cardiovascular: This is a technically satisfactory study. Segmental
pulmonary emboli are identified within the LEFT UPPER lobe, lingula
and LEFT LOWER lobe.

Mild thoracic aortic atherosclerotic calcifications noted without
thoracic aneurysm. A trace pericardial effusion is present.

Mediastinum/Nodes: No definite enlarged lymph nodes. No mediastinal
mass.

Lungs/Pleura: A RIGHT pleural catheter is present with an increasing
large RIGHT pleural effusion with small to moderate amount of
anterior RIGHT pleural air. Near complete compressive atelectasis of
the RIGHT lung is now noted. RIGHT pleural enhancement is again
noted.

A small - moderate LEFT pleural effusion is now noted. Multiple
nodular opacities throughout the visualized LEFT lung are again
identified and stable to slightly enlarged. There is no evidence of
LEFT pneumothorax.

Upper Abdomen: No acute abnormality.

Musculoskeletal: Scattered sclerotic lesions within scattered ribs
and thoracic spine again noted, some which have increased in size. A
1 cm sclerotic lesion within the T8 vertebral body previously
measured 0.5 cm.

Review of the MIP images confirms the above findings.
IMPRESSION: 1. Multiple LEFT segmental pulmonary emboli. New small to moderate
LEFT pleural effusion.
2. Increasing large RIGHT hydropneumothorax (much larger fluid
component) with RIGHT pleural catheter in place. Near complete
compressive atelectasis of the RIGHT lung now noted. RIGHT pleural
enhancement again noted compatible with metastatic disease.
3. Stable to slightly enlarging diffuse nodules throughout the LEFT
lung and enlarging sclerotic bony lesions, compatible with
increasing metastatic disease.

Critical Value/emergent results were called by telephone at the time
of interpretation on 01/31/2019 at [DATE] to JEACQUELINE BUILA, PA
, who verbally acknowledged these results.
